# Patient Record
Sex: Female | Born: 1950
Health system: Southern US, Community
[De-identification: ages and names within clinical notes are randomized; demographics above are authoritative.]

## PROBLEM LIST (undated history)

## (undated) DIAGNOSIS — K219 Gastro-esophageal reflux disease without esophagitis: Secondary | ICD-10-CM

## (undated) DIAGNOSIS — I10 Essential (primary) hypertension: Secondary | ICD-10-CM

## (undated) DIAGNOSIS — D869 Sarcoidosis, unspecified: Secondary | ICD-10-CM

## (undated) HISTORY — PX: BREAST CYST ASPIRATION: SHX578

## (undated) HISTORY — DX: Gastro-esophageal reflux disease without esophagitis: K21.9

## (undated) HISTORY — DX: Sarcoidosis, unspecified: D86.9

## (undated) HISTORY — DX: Essential (primary) hypertension: I10

---

## 1998-05-01 ENCOUNTER — Other Ambulatory Visit: Admission: RE | Admit: 1998-05-01 | Discharge: 1998-05-01 | Payer: Self-pay | Admitting: Obstetrics and Gynecology

## 1999-05-23 ENCOUNTER — Other Ambulatory Visit: Admission: RE | Admit: 1999-05-23 | Discharge: 1999-05-23 | Payer: Self-pay | Admitting: Obstetrics and Gynecology

## 1999-07-16 ENCOUNTER — Encounter: Payer: Self-pay | Admitting: Obstetrics and Gynecology

## 1999-07-16 ENCOUNTER — Ambulatory Visit (HOSPITAL_COMMUNITY): Admission: RE | Admit: 1999-07-16 | Discharge: 1999-07-16 | Payer: Self-pay | Admitting: Obstetrics and Gynecology

## 2000-07-23 ENCOUNTER — Encounter: Payer: Self-pay | Admitting: Obstetrics and Gynecology

## 2000-07-23 ENCOUNTER — Encounter: Admission: RE | Admit: 2000-07-23 | Discharge: 2000-07-23 | Payer: Self-pay | Admitting: Obstetrics and Gynecology

## 2000-09-02 ENCOUNTER — Ambulatory Visit (HOSPITAL_COMMUNITY): Admission: RE | Admit: 2000-09-02 | Discharge: 2000-09-02 | Payer: Self-pay | Admitting: Obstetrics and Gynecology

## 2000-09-02 ENCOUNTER — Encounter: Payer: Self-pay | Admitting: Obstetrics and Gynecology

## 2001-08-11 ENCOUNTER — Other Ambulatory Visit: Admission: RE | Admit: 2001-08-11 | Discharge: 2001-08-11 | Payer: Self-pay | Admitting: Obstetrics and Gynecology

## 2001-09-03 ENCOUNTER — Encounter: Admission: RE | Admit: 2001-09-03 | Discharge: 2001-09-03 | Payer: Self-pay | Admitting: Obstetrics and Gynecology

## 2001-09-03 ENCOUNTER — Encounter: Payer: Self-pay | Admitting: Obstetrics and Gynecology

## 2002-01-20 ENCOUNTER — Encounter: Payer: Self-pay | Admitting: Gastroenterology

## 2002-01-20 ENCOUNTER — Encounter: Admission: RE | Admit: 2002-01-20 | Discharge: 2002-01-20 | Payer: Self-pay | Admitting: Gastroenterology

## 2002-04-05 ENCOUNTER — Ambulatory Visit (HOSPITAL_COMMUNITY): Admission: RE | Admit: 2002-04-05 | Discharge: 2002-04-05 | Payer: Self-pay | Admitting: Gastroenterology

## 2002-08-24 ENCOUNTER — Other Ambulatory Visit: Admission: RE | Admit: 2002-08-24 | Discharge: 2002-08-24 | Payer: Self-pay | Admitting: Obstetrics and Gynecology

## 2002-09-08 ENCOUNTER — Encounter: Payer: Self-pay | Admitting: Obstetrics and Gynecology

## 2002-09-08 ENCOUNTER — Encounter: Admission: RE | Admit: 2002-09-08 | Discharge: 2002-09-08 | Payer: Self-pay | Admitting: Obstetrics and Gynecology

## 2003-11-18 ENCOUNTER — Encounter: Admission: RE | Admit: 2003-11-18 | Discharge: 2003-11-18 | Payer: Self-pay | Admitting: Obstetrics and Gynecology

## 2004-11-21 ENCOUNTER — Encounter: Admission: RE | Admit: 2004-11-21 | Discharge: 2004-11-21 | Payer: Self-pay | Admitting: Obstetrics and Gynecology

## 2005-02-27 ENCOUNTER — Encounter: Admission: RE | Admit: 2005-02-27 | Discharge: 2005-02-27 | Payer: Self-pay | Admitting: Family Medicine

## 2005-12-09 ENCOUNTER — Encounter: Admission: RE | Admit: 2005-12-09 | Discharge: 2005-12-09 | Payer: Self-pay | Admitting: Obstetrics and Gynecology

## 2006-03-17 ENCOUNTER — Encounter: Admission: RE | Admit: 2006-03-17 | Discharge: 2006-03-17 | Payer: Self-pay | Admitting: Family Medicine

## 2006-03-27 ENCOUNTER — Encounter: Admission: RE | Admit: 2006-03-27 | Discharge: 2006-03-27 | Payer: Self-pay | Admitting: Family Medicine

## 2006-03-27 ENCOUNTER — Other Ambulatory Visit: Admission: RE | Admit: 2006-03-27 | Discharge: 2006-03-27 | Payer: Self-pay | Admitting: Diagnostic Radiology

## 2006-03-27 ENCOUNTER — Encounter (INDEPENDENT_AMBULATORY_CARE_PROVIDER_SITE_OTHER): Payer: Self-pay | Admitting: Specialist

## 2006-12-12 ENCOUNTER — Encounter: Admission: RE | Admit: 2006-12-12 | Discharge: 2006-12-12 | Payer: Self-pay | Admitting: Obstetrics and Gynecology

## 2007-05-07 ENCOUNTER — Encounter: Admission: RE | Admit: 2007-05-07 | Discharge: 2007-05-07 | Payer: Self-pay | Admitting: Family Medicine

## 2007-05-15 ENCOUNTER — Encounter: Admission: RE | Admit: 2007-05-15 | Discharge: 2007-05-15 | Payer: Self-pay | Admitting: Obstetrics and Gynecology

## 2007-12-21 ENCOUNTER — Encounter: Admission: RE | Admit: 2007-12-21 | Discharge: 2007-12-21 | Payer: Self-pay | Admitting: Obstetrics and Gynecology

## 2008-12-23 ENCOUNTER — Encounter: Admission: RE | Admit: 2008-12-23 | Discharge: 2008-12-23 | Payer: Self-pay | Admitting: Family Medicine

## 2008-12-28 ENCOUNTER — Encounter: Admission: RE | Admit: 2008-12-28 | Discharge: 2008-12-28 | Payer: Self-pay | Admitting: Obstetrics and Gynecology

## 2009-11-01 ENCOUNTER — Encounter (INDEPENDENT_AMBULATORY_CARE_PROVIDER_SITE_OTHER): Payer: Self-pay | Admitting: *Deleted

## 2009-11-29 ENCOUNTER — Encounter (INDEPENDENT_AMBULATORY_CARE_PROVIDER_SITE_OTHER): Payer: Self-pay | Admitting: *Deleted

## 2009-11-30 ENCOUNTER — Ambulatory Visit: Payer: Self-pay | Admitting: Gastroenterology

## 2009-12-08 ENCOUNTER — Ambulatory Visit: Payer: Self-pay | Admitting: Gastroenterology

## 2010-01-01 ENCOUNTER — Encounter: Admission: RE | Admit: 2010-01-01 | Discharge: 2010-01-01 | Payer: Self-pay | Admitting: Obstetrics and Gynecology

## 2010-06-19 NOTE — Procedures (Signed)
Summary: Colonoscopy  Patient: [name] Note: All result statuses are Final unless otherwise noted.  Tests: (1) Colonoscopy (COL)   COL Colonoscopy           DONE     [facility]     [address]     [place], [place]  [address]           COLONOSCOPY PROCEDURE REPORT           PATIENT:  Natasha Stewart, Natasha Stewart  MR#:  [id number]     BIRTHDATE:  01/04/1951, 59 yrs. old  GENDER:  female     ENDOSCOPIST:  Vania Rea. Jarold Motto, MD, [facility]     REF. BY:     PROCEDURE DATE:  12/08/2009     PROCEDURE:  Average-risk screening colonoscopy     G0121     ASA CLASS:  Class II     INDICATIONS:  Routine Risk Screening     MEDICATIONS:   Fentanyl 75 mcg IV, Versed 6 mg IV           DESCRIPTION OF PROCEDURE:   After the risks benefits and     alternatives of the procedure were thoroughly explained, informed     consent was obtained.  Digital rectal exam was performed and     revealed no abnormalities.   The LB CF-H180AL [id number] endoscope     was introduced through the anus and advanced to the cecum, which     was identified by both the appendix and ileocecal valve, without     limitations.  The quality of the prep was excellent, using     MoviPrep.  The instrument was then slowly withdrawn as the colon     was fully examined.     <<PROCEDUREIMAGES>>           FINDINGS:  No polyps or cancers were seen.  This was otherwise a     normal examination of the colon.   Retroflexed views in the rectum     revealed Unable to retroflex.    The scope was then withdrawn from     the patient and the procedure completed.           COMPLICATIONS:  None     ENDOSCOPIC IMPRESSION:     1) No polyps or cancers     2) Otherwise normal examination     3) Unable to retroflex     RECOMMENDATIONS:     1) Continue current colorectal screening recommendations for     "routine risk" patients with a repeat colonoscopy in 10 years.     REPEAT EXAM:  No           ______________________________     Vania Rea.  Jarold Motto, MD, Clement[facility] Graham           CC:  Lupe Carney, MD           n.     Rosalie Doctor:   Vania Rea. Ripley Bogosian at 12/08/2009 04:37 PM           Marilynn Latino, [id number]  Note: An exclamation mark (!) indicates a result that was not dispersed into the flowsheet. Document Creation Date: 12/08/2009 4:38 PM _______________________________________________________________________  (1) Order result status: Final Collection or observation date-time: 12/08/2009 16:31 Requested date-time:  Receipt date-time:  Reported date-time:  Referring Physician:   Ordering Physician: Sheryn Bison [phone]) Specimen Source:  Source: Launa Grill Order Number: [id number] Lab site:   Appended Document: Colonoscopy    Clinical  Lists Changes  Observations: Added new observation of COLONNXTDUE: 11/2019 (12/08/2009 16:56)

## 2010-06-19 NOTE — Miscellaneous (Signed)
Summary: DIR COL .Marland KitchenMarland KitchenEM  Clinical Lists Changes  Medications: Added new medication of MOVIPREP 100 GM  SOLR (PEG-KCL-NACL-NASULF-NA ASC-C) As directed - Signed Rx of MOVIPREP 100 GM  SOLR (PEG-KCL-NACL-NASULF-NA ASC-C) As directed;  #1 x 0;  Signed;  Entered by: Clide Cliff RN;  Authorized by: Mardella Layman MD [facility];  Method used: Print then Give to Patient Observations: Added new observation of ALLERGY REV: Done (11/30/2009 16:13)    Prescriptions: MOVIPREP 100 GM  SOLR (PEG-KCL-NACL-NASULF-NA ASC-C) As directed  #1 x 0   Entered by:   Clide Cliff RN   Authorized by:   Mardella Layman MD [facility]   Signed by:   Clide Cliff RN on 11/30/2009   Method used:   Print then Give to Patient   RxID:   [id number]

## 2010-06-19 NOTE — Letter (Signed)
Summary: [facility] Instructions  [facility]  [address] [place], [place] [address]   Phone: [phone]  Fax: [phone]       Natasha Stewart    1950-12-13    MRN: [id number]        Procedure Day Dorna Bloom:  Farrell Ours  12/08/09     Arrival Time:  3:00PM     Procedure Time:  4:00PM     Location of Procedure:                    Juliann Pares  [facility] (4th Floor)                        PREPARATION FOR COLONOSCOPY WITH MOVIPREP   Starting 5 days prior to your procedure 12/05/09 do not eat nuts, seeds, popcorn, corn, beans, peas,  salads, or any raw vegetables.  Do not take any fiber supplements (e.g. Metamucil, Citrucel, and Benefiber).  THE DAY BEFORE YOUR PROCEDURE         DATE:12/07/09  DAY: THURSDAY  1.  Drink clear liquids the entire day-NO SOLID FOOD  2.  Do not drink anything colored red or purple.  Avoid juices with pulp.  No orange juice.  3.  Drink at least 64 oz. (8 glasses) of fluid/clear liquids during the day to prevent dehydration and help the prep work efficiently.  CLEAR LIQUIDS INCLUDE: Water Jello Ice Popsicles Tea (sugar ok, no milk/cream) Powdered fruit flavored drinks Coffee (sugar ok, no milk/cream) Gatorade Juice: apple, white grape, white cranberry  Lemonade Clear bullion, consomm, broth Carbonated beverages (any kind) Strained chicken noodle soup Hard Candy                             4.  In the morning, mix first dose of MoviPrep solution:    Empty 1 Pouch A and 1 Pouch B into the disposable container    Add lukewarm drinking water to the top line of the container. Mix to dissolve    Refrigerate (mixed solution should be used within 24 hrs)  5.  Begin drinking the prep at 5:00 p.m. The MoviPrep container is divided by 4 marks.   Every 15 minutes drink the solution down to the next mark (approximately 8 oz) until the full liter is complete.   6.  Follow completed prep with 16 oz of clear liquid of your choice (Nothing  red or purple).  Continue to drink clear liquids until bedtime.  7.  Before going to bed, mix second dose of MoviPrep solution:    Empty 1 Pouch A and 1 Pouch B into the disposable container    Add lukewarm drinking water to the top line of the container. Mix to dissolve    Refrigerate  THE DAY OF YOUR PROCEDURE      DATE: 12/08/09  DAY: FRIDAY  Beginning at 11:00AM (5 hours before procedure):         1. Every 15 minutes, drink the solution down to the next mark (approx 8 oz) until the full liter is complete.  2. Follow completed prep with 16 oz. of clear liquid of your choice.    3. You may drink clear liquids until 2:00PM (2 HOURS BEFORE PROCEDURE).   MEDICATION INSTRUCTIONS  Unless otherwise instructed, you should take regular prescription medications with a small sip of water   as early as possible the morning of  your procedure.           OTHER INSTRUCTIONS  You will need a responsible adult at least 59 years of age to accompany you and drive you home.   This person must remain in the waiting room during your procedure.  Wear loose fitting clothing that is easily removed.  Leave jewelry and other valuables at home.  However, you may wish to bring a book to read or  an iPod/MP3 player to listen to music as you wait for your procedure to start.  Remove all body piercing jewelry and leave at home.  Total time from sign-in until discharge is approximately 2-3 hours.  You should go home directly after your procedure and rest.  You can resume normal activities the  day after your procedure.  The day of your procedure you should not:   Drive   Make legal decisions   Operate machinery   Drink alcohol   Return to work  You will receive specific instructions about eating, activities and medications before you leave.    The above instructions have been reviewed and explained to me by   Ernestine Conrad, RN______________________    I fully understand and can  verbalize these instructions _____________________________ Date _________

## 2010-06-19 NOTE — Letter (Signed)
Summary: Previsit letter  [facility]  [address] [place], [place] [address]   Phone: [phone]  Fax: [phone]       11/01/2009 MRN: [id number]  [facility] [address] [place], [place]  [address]  Dear Ms. BAYNES,  Welcome to the Gastroenterology Division at [facility].    You are scheduled to see a nurse for your pre-procedure visit on 11-06-09 at 4:00p.m. on the 3rd floor at [facility], [address]  We ask that you try to arrive at our office 15 minutes prior to your appointment time to allow for check-in.  Your nurse visit will consist of discussing your medical and surgical history, your immediate family medical history, and your medications.    Please bring a complete list of all your medications or, if you prefer, bring the medication bottles and we will list them.  We will need to be aware of both prescribed and over the counter drugs.  We will need to know exact dosage information as well.  If you are on blood thinners (Coumadin, Plavix, Aggrenox, Ticlid, etc.) please call our office today/prior to your appointment, as we need to consult with your physician about holding your medication.   Please be prepared to read and sign documents such as consent forms, a financial agreement, and acknowledgement forms.  If necessary, and with your consent, a friend or relative is welcome to sit-in on the nurse visit with you.  Please bring your insurance card so that we may make a copy of it.  If your insurance requires a referral to see a specialist, please bring your referral form from your primary care physician.  No co-pay is required for this nurse visit.     If you cannot keep your appointment, please call [phone] to cancel or reschedule prior to your appointment date.  This allows [place] the opportunity to schedule an appointment for another patient in need of care.    Thank you for choosing [facility] for your medical needs.   We appreciate the opportunity to care for you.  Please visit [place] at our website  to learn more about our practice.                     Sincerely.                                                                                                                   The Gastroenterology Division

## 2010-07-06 ENCOUNTER — Other Ambulatory Visit: Payer: Self-pay | Admitting: Obstetrics and Gynecology

## 2010-07-06 DIAGNOSIS — M8580 Other specified disorders of bone density and structure, unspecified site: Secondary | ICD-10-CM

## 2010-10-05 NOTE — Op Note (Signed)
   Natasha Stewart, Natasha Stewart                          ACCOUNT NO.:  [id number]   MEDICAL RECORD NO.:  [id number]                   PATIENT TYPE:  AMB   LOCATION:  ENDO                                 FACILITY:  [facility]   PHYSICIAN:  James L. Malon Kindle., M.D.          DATE OF BIRTH:  April 19, 1951   DATE OF PROCEDURE:  04/05/2002  DATE OF DISCHARGE:  04/05/2002                                 OPERATIVE REPORT   PROCEDURE:  Esophageal manometry.   INDICATIONS:  Dysphagia with negative endoscopy and basically negative  barium swallow showing some mild spasm without evidence of any other  abnormalities.  Manometry is performed to look for signs of esophageal  spasm.  She is currently being treated for reflux.   DESCRIPTION OF PROCEDURE:  The procedure was performed at [facility]  manometry lab in the usual manner.  No provocative studies were performed.  The results were as follows:   1. Upper esophageal sphincter poorly-seen.  2. Esophageal body:  Mean pressure 96 mm, mean duration of 4.4 seconds in     the distal esophagus.  Peristalsis was normal in all 10 swallows     presented to me.  3. Lower esophageal sphincter:  Mean pressure of 39 with 85% relaxation, and     this was confirmed by the tracings presented.   ASSESSMENT:  Dysphagia with normal manometry, with no evidence of achalasia  or esophageal spasm.   PLAN:  Will continue to treat for reflux and see back in the office and  discuss things further with her.                                               James L. Malon Kindle., M.D.    Waldron Session  D:  04/20/2002  T:  04/21/2002  Job:  [id number]   cc:   L. Lupe Carney, M.D.  [address] [place]  [place] [address]  Fax: [phone]

## 2010-11-16 ENCOUNTER — Telehealth: Payer: Self-pay | Admitting: Gastroenterology

## 2010-11-16 NOTE — Telephone Encounter (Signed)
COLON 12/08/2009 that was normal except Dr Jarold Motto was unable to retroflex the scope. lmom for pt to call back.

## 2010-11-19 NOTE — Telephone Encounter (Signed)
Explained to pt that her COLON was normal except for the retroflex problem which could have been d/t her muscles being tight or a smaller rectum. Pt stated she's just looking for a reason for the swelling on her right side at her belly button. She reports the feeling is uncomfortable in her clothes. No pain, no diarrhea or constipation- she's regular. She states the only med she's on is HTN med and vitamins. She denies reflyx. Pt stated has has seen  her GYN and has no any problems. I offered an appt with Dr Jarold Motto, but she will see her PCP.

## 2010-12-04 ENCOUNTER — Other Ambulatory Visit: Payer: Self-pay | Admitting: Obstetrics and Gynecology

## 2010-12-04 DIAGNOSIS — Z1231 Encounter for screening mammogram for malignant neoplasm of breast: Secondary | ICD-10-CM

## 2011-01-28 ENCOUNTER — Ambulatory Visit
Admission: RE | Admit: 2011-01-28 | Discharge: 2011-01-28 | Disposition: A | Discharge status: Home / Self Care | Payer: BC Managed Care – PPO | Source: Ambulatory Visit | Attending: Obstetrics and Gynecology | Admitting: Obstetrics and Gynecology

## 2011-01-28 DIAGNOSIS — Z1231 Encounter for screening mammogram for malignant neoplasm of breast: Secondary | ICD-10-CM

## 2011-04-12 IMAGING — CR DG CHEST 2V
2 series · 2 of 2 positions shown · non-contrast
Comparison: [HOSPITAL] at [REDACTED] [HOSPITAL] the chest x-ray
02/27/2005 and 07/07/2006.

CLINICAL DATA: History sarcoidosis.  Cough.  Nonsmoker.

CHEST - 2 VIEW

[w chest pa]
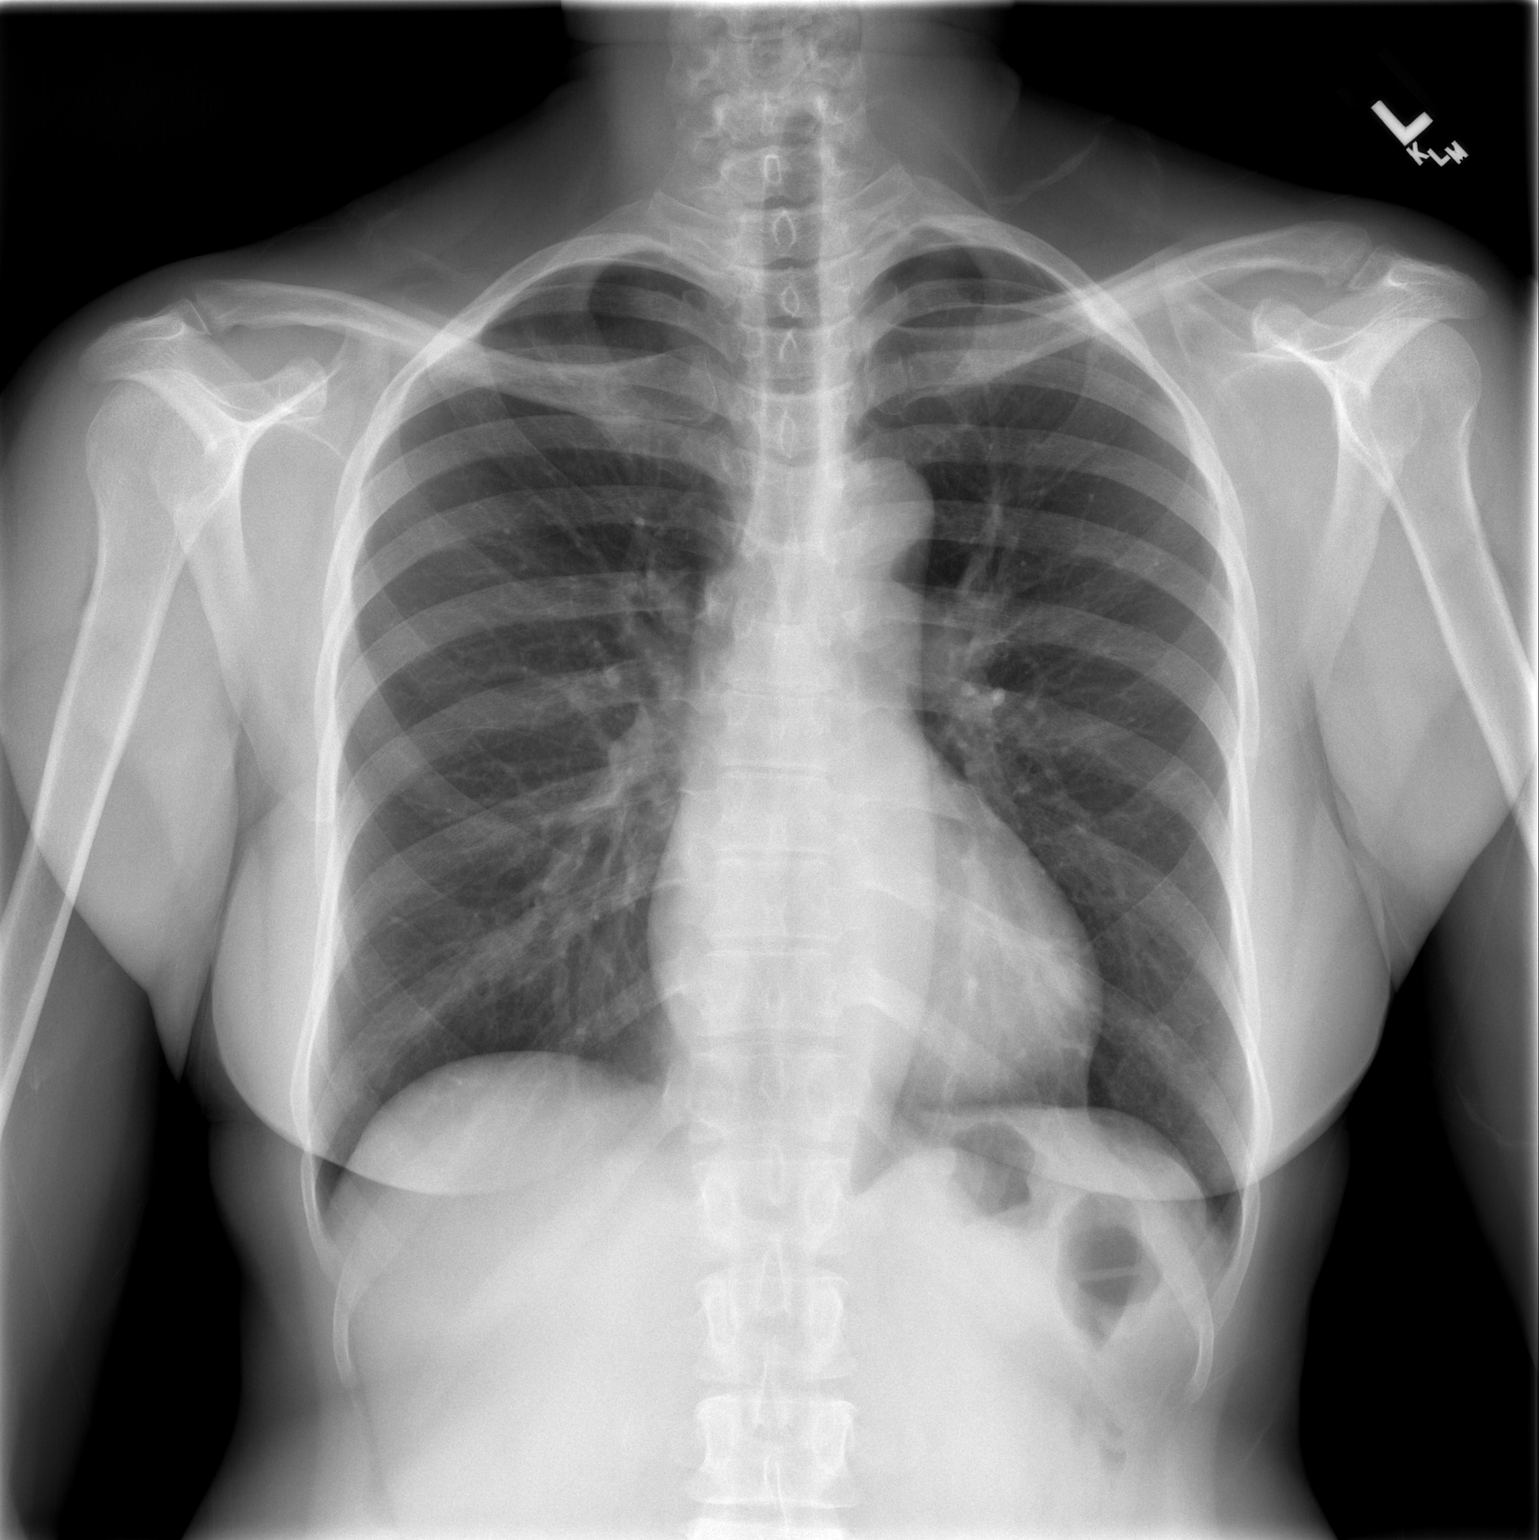

[w chest lat]
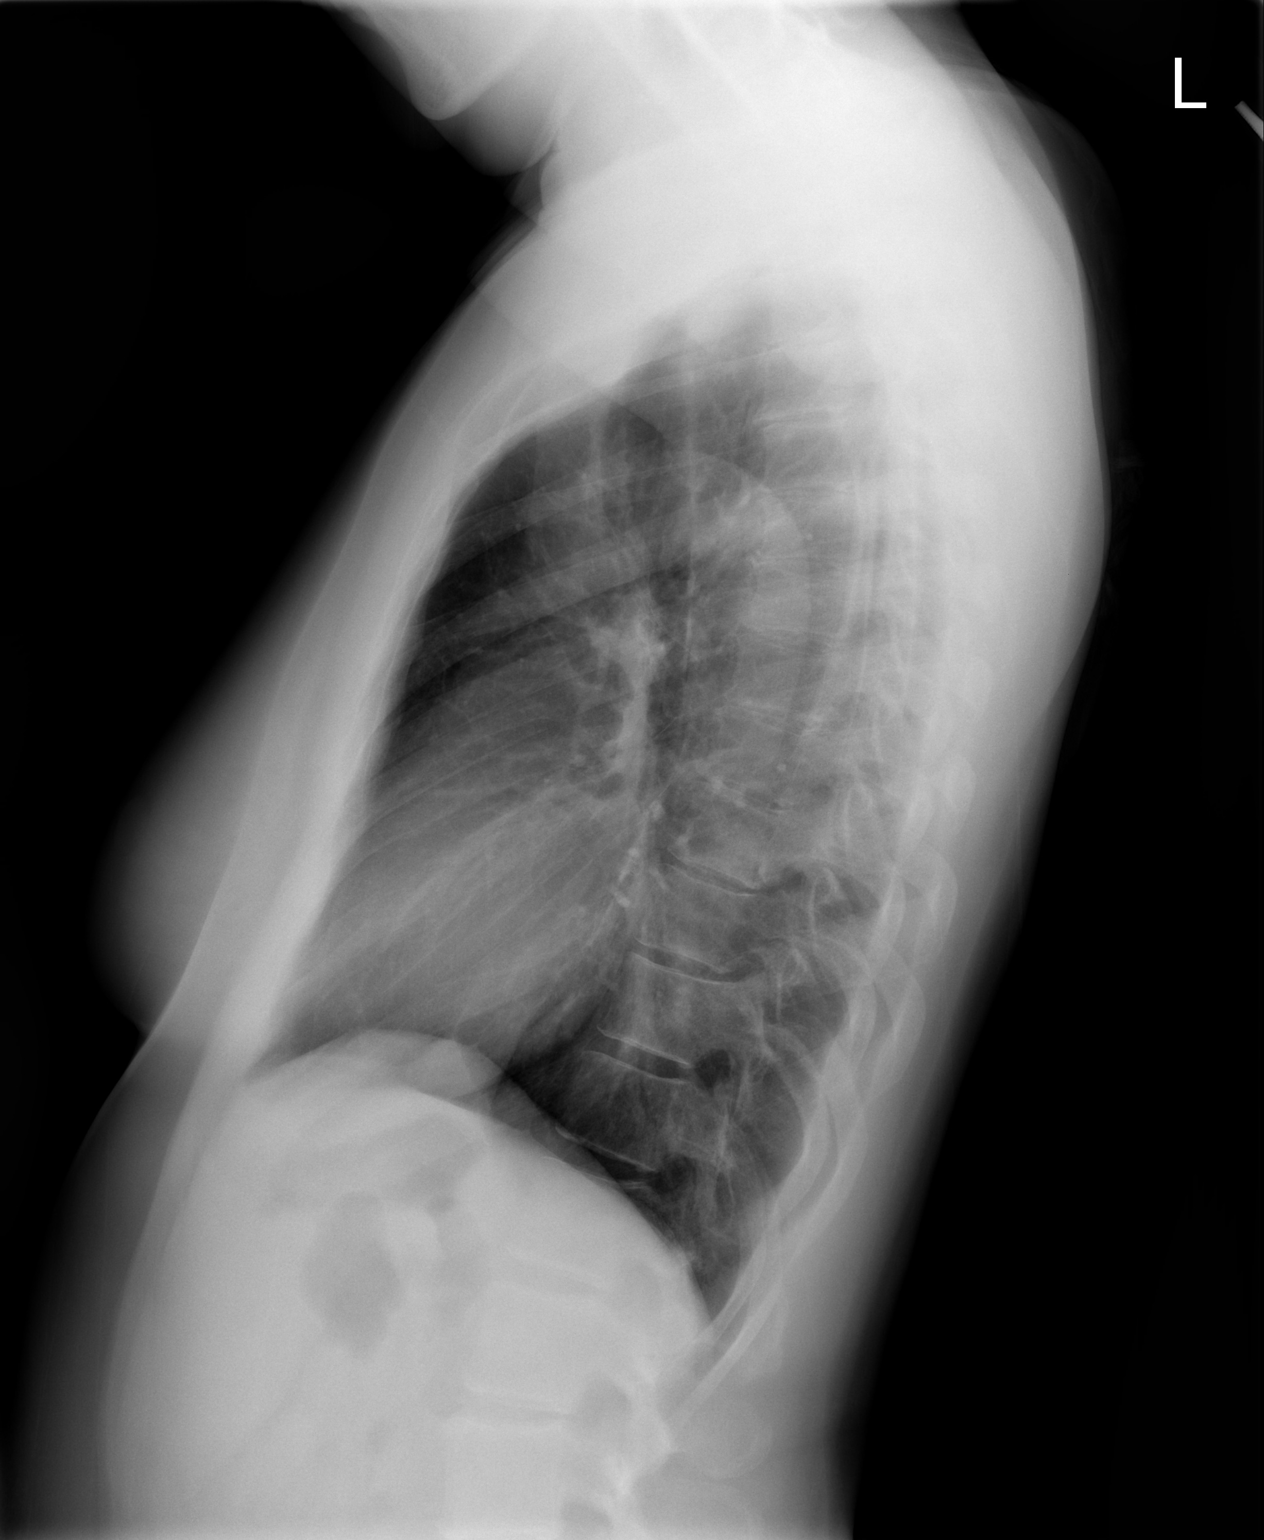

[2 of 2 positions shown; findings below may reference images not displayed]

FINDINGS: Lungs remain clear.  Heart size and configuration are
normal.  Mediastinum, hila, pleura and osseous structures remains
stable and normal-appearing for age.
IMPRESSION: Stable.  No active cardiopulmonary disease.

## 2012-01-08 ENCOUNTER — Other Ambulatory Visit: Payer: Self-pay | Admitting: Obstetrics and Gynecology

## 2012-01-08 DIAGNOSIS — Z1231 Encounter for screening mammogram for malignant neoplasm of breast: Secondary | ICD-10-CM

## 2012-02-05 ENCOUNTER — Ambulatory Visit
Admission: RE | Admit: 2012-02-05 | Discharge: 2012-02-05 | Disposition: A | Discharge status: Home / Self Care | Payer: BC Managed Care – PPO | Source: Ambulatory Visit | Attending: Obstetrics and Gynecology | Admitting: Obstetrics and Gynecology

## 2012-02-05 DIAGNOSIS — Z1231 Encounter for screening mammogram for malignant neoplasm of breast: Secondary | ICD-10-CM

## 2013-01-07 ENCOUNTER — Other Ambulatory Visit: Payer: Self-pay

## 2013-01-07 DIAGNOSIS — Z1231 Encounter for screening mammogram for malignant neoplasm of breast: Secondary | ICD-10-CM

## 2013-02-08 ENCOUNTER — Ambulatory Visit
Admission: RE | Admit: 2013-02-08 | Discharge: 2013-02-08 | Disposition: A | Discharge status: Home / Self Care | Payer: BC Managed Care – PPO | Source: Ambulatory Visit

## 2013-02-08 DIAGNOSIS — Z1231 Encounter for screening mammogram for malignant neoplasm of breast: Secondary | ICD-10-CM

## 2014-01-10 ENCOUNTER — Other Ambulatory Visit: Payer: Self-pay

## 2014-01-10 DIAGNOSIS — Z1231 Encounter for screening mammogram for malignant neoplasm of breast: Secondary | ICD-10-CM

## 2014-02-11 ENCOUNTER — Ambulatory Visit
Admission: RE | Admit: 2014-02-11 | Discharge: 2014-02-11 | Disposition: A | Discharge status: Home / Self Care | Payer: BC Managed Care – PPO | Source: Ambulatory Visit

## 2014-02-11 ENCOUNTER — Encounter (INDEPENDENT_AMBULATORY_CARE_PROVIDER_SITE_OTHER): Payer: Self-pay

## 2014-02-11 DIAGNOSIS — Z1231 Encounter for screening mammogram for malignant neoplasm of breast: Secondary | ICD-10-CM

## 2014-10-07 ENCOUNTER — Encounter: Payer: Self-pay | Admitting: Gastroenterology

## 2015-01-09 ENCOUNTER — Other Ambulatory Visit: Payer: Self-pay

## 2015-01-09 DIAGNOSIS — Z1231 Encounter for screening mammogram for malignant neoplasm of breast: Secondary | ICD-10-CM

## 2015-02-15 ENCOUNTER — Ambulatory Visit
Admission: RE | Admit: 2015-02-15 | Discharge: 2015-02-15 | Disposition: A | Discharge status: Home / Self Care | Payer: 59 | Source: Ambulatory Visit

## 2015-02-15 DIAGNOSIS — Z1231 Encounter for screening mammogram for malignant neoplasm of breast: Secondary | ICD-10-CM

## 2016-01-26 ENCOUNTER — Other Ambulatory Visit: Payer: Self-pay | Admitting: Obstetrics and Gynecology

## 2016-01-26 DIAGNOSIS — Z1231 Encounter for screening mammogram for malignant neoplasm of breast: Secondary | ICD-10-CM

## 2016-02-28 ENCOUNTER — Ambulatory Visit
Admission: RE | Admit: 2016-02-28 | Discharge: 2016-02-28 | Disposition: A | Discharge status: Home / Self Care | Payer: 59 | Source: Ambulatory Visit | Attending: Obstetrics and Gynecology | Admitting: Obstetrics and Gynecology

## 2016-02-28 DIAGNOSIS — Z1231 Encounter for screening mammogram for malignant neoplasm of breast: Secondary | ICD-10-CM

## 2017-01-17 ENCOUNTER — Other Ambulatory Visit: Payer: Self-pay | Admitting: Obstetrics and Gynecology

## 2017-01-17 DIAGNOSIS — Z1231 Encounter for screening mammogram for malignant neoplasm of breast: Secondary | ICD-10-CM

## 2017-03-12 ENCOUNTER — Ambulatory Visit
Admission: RE | Admit: 2017-03-12 | Discharge: 2017-03-12 | Disposition: A | Discharge status: Home / Self Care | Payer: 59 | Source: Ambulatory Visit | Attending: Obstetrics and Gynecology | Admitting: Obstetrics and Gynecology

## 2017-03-12 DIAGNOSIS — Z1231 Encounter for screening mammogram for malignant neoplasm of breast: Secondary | ICD-10-CM

## 2017-03-13 ENCOUNTER — Other Ambulatory Visit: Payer: Self-pay | Admitting: Obstetrics and Gynecology

## 2017-03-13 DIAGNOSIS — R928 Other abnormal and inconclusive findings on diagnostic imaging of breast: Secondary | ICD-10-CM

## 2017-03-21 ENCOUNTER — Ambulatory Visit: Payer: 59

## 2017-03-21 ENCOUNTER — Ambulatory Visit
Admission: RE | Admit: 2017-03-21 | Discharge: 2017-03-21 | Disposition: A | Discharge status: Home / Self Care | Payer: 59 | Source: Ambulatory Visit | Attending: Obstetrics and Gynecology | Admitting: Obstetrics and Gynecology

## 2017-03-21 DIAGNOSIS — R928 Other abnormal and inconclusive findings on diagnostic imaging of breast: Secondary | ICD-10-CM

## 2018-06-23 DIAGNOSIS — B9789 Other viral agents as the cause of diseases classified elsewhere: Secondary | ICD-10-CM | POA: Diagnosis not present

## 2018-06-23 DIAGNOSIS — J069 Acute upper respiratory infection, unspecified: Secondary | ICD-10-CM | POA: Diagnosis not present

## 2018-06-23 DIAGNOSIS — I10 Essential (primary) hypertension: Secondary | ICD-10-CM | POA: Diagnosis not present

## 2018-11-04 DIAGNOSIS — H35013 Changes in retinal vascular appearance, bilateral: Secondary | ICD-10-CM | POA: Diagnosis not present

## 2018-11-04 DIAGNOSIS — H35363 Drusen (degenerative) of macula, bilateral: Secondary | ICD-10-CM | POA: Diagnosis not present

## 2018-11-04 DIAGNOSIS — H40013 Open angle with borderline findings, low risk, bilateral: Secondary | ICD-10-CM | POA: Diagnosis not present

## 2018-11-04 DIAGNOSIS — H2513 Age-related nuclear cataract, bilateral: Secondary | ICD-10-CM | POA: Diagnosis not present

## 2018-12-08 DIAGNOSIS — D649 Anemia, unspecified: Secondary | ICD-10-CM | POA: Diagnosis not present

## 2018-12-08 DIAGNOSIS — I10 Essential (primary) hypertension: Secondary | ICD-10-CM | POA: Diagnosis not present

## 2018-12-08 DIAGNOSIS — Z0000 Encounter for general adult medical examination without abnormal findings: Secondary | ICD-10-CM | POA: Diagnosis not present

## 2018-12-11 DIAGNOSIS — Z9189 Other specified personal risk factors, not elsewhere classified: Secondary | ICD-10-CM | POA: Diagnosis not present

## 2018-12-11 DIAGNOSIS — R05 Cough: Secondary | ICD-10-CM | POA: Diagnosis not present

## 2018-12-11 DIAGNOSIS — Z23 Encounter for immunization: Secondary | ICD-10-CM | POA: Diagnosis not present

## 2018-12-11 DIAGNOSIS — Z791 Long term (current) use of non-steroidal anti-inflammatories (NSAID): Secondary | ICD-10-CM | POA: Diagnosis not present

## 2018-12-11 DIAGNOSIS — Z0000 Encounter for general adult medical examination without abnormal findings: Secondary | ICD-10-CM | POA: Diagnosis not present

## 2018-12-11 DIAGNOSIS — M5442 Lumbago with sciatica, left side: Secondary | ICD-10-CM | POA: Diagnosis not present

## 2018-12-11 DIAGNOSIS — I10 Essential (primary) hypertension: Secondary | ICD-10-CM | POA: Diagnosis not present

## 2018-12-11 DIAGNOSIS — G8929 Other chronic pain: Secondary | ICD-10-CM | POA: Diagnosis not present

## 2018-12-23 DIAGNOSIS — J984 Other disorders of lung: Secondary | ICD-10-CM | POA: Diagnosis not present

## 2019-02-12 DIAGNOSIS — Z23 Encounter for immunization: Secondary | ICD-10-CM | POA: Diagnosis not present

## 2019-03-24 DIAGNOSIS — M79645 Pain in left finger(s): Secondary | ICD-10-CM | POA: Diagnosis not present

## 2019-03-24 DIAGNOSIS — M65312 Trigger thumb, left thumb: Secondary | ICD-10-CM | POA: Diagnosis not present

## 2019-04-27 DIAGNOSIS — Z01419 Encounter for gynecological examination (general) (routine) without abnormal findings: Secondary | ICD-10-CM | POA: Diagnosis not present

## 2019-04-27 DIAGNOSIS — Z1231 Encounter for screening mammogram for malignant neoplasm of breast: Secondary | ICD-10-CM | POA: Diagnosis not present

## 2019-04-28 DIAGNOSIS — M65312 Trigger thumb, left thumb: Secondary | ICD-10-CM | POA: Diagnosis not present

## 2019-05-18 DIAGNOSIS — H40013 Open angle with borderline findings, low risk, bilateral: Secondary | ICD-10-CM | POA: Diagnosis not present

## 2019-05-18 DIAGNOSIS — H04123 Dry eye syndrome of bilateral lacrimal glands: Secondary | ICD-10-CM | POA: Diagnosis not present

## 2019-06-15 DIAGNOSIS — I10 Essential (primary) hypertension: Secondary | ICD-10-CM | POA: Diagnosis not present

## 2019-06-15 DIAGNOSIS — M5442 Lumbago with sciatica, left side: Secondary | ICD-10-CM | POA: Diagnosis not present

## 2019-06-15 DIAGNOSIS — G8929 Other chronic pain: Secondary | ICD-10-CM | POA: Diagnosis not present

## 2019-07-11 ENCOUNTER — Ambulatory Visit: Payer: PPO | Attending: Internal Medicine

## 2019-07-11 DIAGNOSIS — Z23 Encounter for immunization: Secondary | ICD-10-CM | POA: Insufficient documentation

## 2019-07-11 NOTE — Progress Notes (Signed)
   Covid-19 Vaccination Clinic  Name:  Natasha Stewart    MRN: [id number] DOB: 1950/07/28  07/11/2019  [name] was observed post Covid-19 immunization for 15 minutes without incidence. She was provided with Vaccine Information Sheet and instruction to access the V-Safe system.   [name] was instructed to call 911 with any severe reactions post vaccine: Marland Kitchen Difficulty breathing  . Swelling of your face and throat  . A fast heartbeat  . A bad rash all over your body  . Dizziness and weakness    Immunizations Administered    Name Date Dose VIS Date Route   Pfizer COVID-19 Vaccine 07/11/2019  2:55 PM 0.3 mL 04/30/2019 Intramuscular   Manufacturer: [place]   Lot: [id number]   [place]: [id number]

## 2019-08-04 ENCOUNTER — Ambulatory Visit: Payer: PPO | Attending: Internal Medicine

## 2019-08-04 DIAGNOSIS — Z23 Encounter for immunization: Secondary | ICD-10-CM

## 2019-08-04 NOTE — Progress Notes (Signed)
   Covid-19 Vaccination Clinic  Name:  Natasha Stewart    MRN: [id number] DOB: Jun 24, 1950  08/04/2019  [name] was observed post Covid-19 immunization for 15 minutes without incident. She was provided with Vaccine Information Sheet and instruction to access the V-Safe system.   [name] was instructed to call 911 with any severe reactions post vaccine: Marland Kitchen Difficulty breathing  . Swelling of face and throat  . A fast heartbeat  . A bad rash all over body  . Dizziness and weakness   Immunizations Administered    Name Date Dose VIS Date Route   Pfizer COVID-19 Vaccine 08/04/2019  2:35 PM 0.3 mL 04/30/2019 Intramuscular   Manufacturer: [place]   Lot: [id number]   [place]: [id number]

## 2019-08-18 DIAGNOSIS — M5137 Other intervertebral disc degeneration, lumbosacral region: Secondary | ICD-10-CM | POA: Diagnosis not present

## 2019-08-18 DIAGNOSIS — M5442 Lumbago with sciatica, left side: Secondary | ICD-10-CM | POA: Diagnosis not present

## 2019-08-18 DIAGNOSIS — M4697 Unspecified inflammatory spondylopathy, lumbosacral region: Secondary | ICD-10-CM | POA: Diagnosis not present

## 2019-08-18 DIAGNOSIS — G8929 Other chronic pain: Secondary | ICD-10-CM | POA: Diagnosis not present

## 2019-08-18 DIAGNOSIS — M4696 Unspecified inflammatory spondylopathy, lumbar region: Secondary | ICD-10-CM | POA: Diagnosis not present

## 2019-08-18 DIAGNOSIS — M5136 Other intervertebral disc degeneration, lumbar region: Secondary | ICD-10-CM | POA: Diagnosis not present

## 2019-08-31 DIAGNOSIS — R29898 Other symptoms and signs involving the musculoskeletal system: Secondary | ICD-10-CM | POA: Diagnosis not present

## 2019-08-31 DIAGNOSIS — G8929 Other chronic pain: Secondary | ICD-10-CM | POA: Diagnosis not present

## 2019-08-31 DIAGNOSIS — M6289 Other specified disorders of muscle: Secondary | ICD-10-CM | POA: Diagnosis not present

## 2019-08-31 DIAGNOSIS — M5442 Lumbago with sciatica, left side: Secondary | ICD-10-CM | POA: Diagnosis not present

## 2019-08-31 DIAGNOSIS — Z7409 Other reduced mobility: Secondary | ICD-10-CM | POA: Diagnosis not present

## 2019-08-31 DIAGNOSIS — Z789 Other specified health status: Secondary | ICD-10-CM | POA: Diagnosis not present

## 2019-09-21 DIAGNOSIS — G8929 Other chronic pain: Secondary | ICD-10-CM | POA: Diagnosis not present

## 2019-09-21 DIAGNOSIS — R29898 Other symptoms and signs involving the musculoskeletal system: Secondary | ICD-10-CM | POA: Diagnosis not present

## 2019-09-21 DIAGNOSIS — M5442 Lumbago with sciatica, left side: Secondary | ICD-10-CM | POA: Diagnosis not present

## 2019-09-21 DIAGNOSIS — M6289 Other specified disorders of muscle: Secondary | ICD-10-CM | POA: Diagnosis not present

## 2019-09-21 DIAGNOSIS — Z789 Other specified health status: Secondary | ICD-10-CM | POA: Diagnosis not present

## 2019-09-21 DIAGNOSIS — Z7409 Other reduced mobility: Secondary | ICD-10-CM | POA: Diagnosis not present

## 2019-09-28 DIAGNOSIS — Z7409 Other reduced mobility: Secondary | ICD-10-CM | POA: Diagnosis not present

## 2019-09-28 DIAGNOSIS — R29898 Other symptoms and signs involving the musculoskeletal system: Secondary | ICD-10-CM | POA: Diagnosis not present

## 2019-09-28 DIAGNOSIS — M5442 Lumbago with sciatica, left side: Secondary | ICD-10-CM | POA: Diagnosis not present

## 2019-09-28 DIAGNOSIS — M6289 Other specified disorders of muscle: Secondary | ICD-10-CM | POA: Diagnosis not present

## 2019-09-28 DIAGNOSIS — G8929 Other chronic pain: Secondary | ICD-10-CM | POA: Diagnosis not present

## 2019-09-28 DIAGNOSIS — Z789 Other specified health status: Secondary | ICD-10-CM | POA: Diagnosis not present

## 2019-10-12 DIAGNOSIS — R29898 Other symptoms and signs involving the musculoskeletal system: Secondary | ICD-10-CM | POA: Diagnosis not present

## 2019-10-12 DIAGNOSIS — M6289 Other specified disorders of muscle: Secondary | ICD-10-CM | POA: Diagnosis not present

## 2019-10-12 DIAGNOSIS — M5442 Lumbago with sciatica, left side: Secondary | ICD-10-CM | POA: Diagnosis not present

## 2019-10-12 DIAGNOSIS — Z789 Other specified health status: Secondary | ICD-10-CM | POA: Diagnosis not present

## 2019-10-12 DIAGNOSIS — G8929 Other chronic pain: Secondary | ICD-10-CM | POA: Diagnosis not present

## 2019-10-12 DIAGNOSIS — Z7409 Other reduced mobility: Secondary | ICD-10-CM | POA: Diagnosis not present

## 2019-12-17 DIAGNOSIS — D649 Anemia, unspecified: Secondary | ICD-10-CM | POA: Diagnosis not present

## 2019-12-17 DIAGNOSIS — Z9189 Other specified personal risk factors, not elsewhere classified: Secondary | ICD-10-CM | POA: Diagnosis not present

## 2019-12-17 DIAGNOSIS — I10 Essential (primary) hypertension: Secondary | ICD-10-CM | POA: Diagnosis not present

## 2019-12-21 DIAGNOSIS — Z1211 Encounter for screening for malignant neoplasm of colon: Secondary | ICD-10-CM | POA: Diagnosis not present

## 2019-12-21 DIAGNOSIS — J019 Acute sinusitis, unspecified: Secondary | ICD-10-CM | POA: Diagnosis not present

## 2019-12-21 DIAGNOSIS — D649 Anemia, unspecified: Secondary | ICD-10-CM | POA: Diagnosis not present

## 2019-12-21 DIAGNOSIS — I10 Essential (primary) hypertension: Secondary | ICD-10-CM | POA: Diagnosis not present

## 2019-12-21 DIAGNOSIS — Z9189 Other specified personal risk factors, not elsewhere classified: Secondary | ICD-10-CM | POA: Diagnosis not present

## 2019-12-21 DIAGNOSIS — Z79899 Other long term (current) drug therapy: Secondary | ICD-10-CM | POA: Diagnosis not present

## 2019-12-21 DIAGNOSIS — Z0000 Encounter for general adult medical examination without abnormal findings: Secondary | ICD-10-CM | POA: Diagnosis not present

## 2020-01-18 DIAGNOSIS — H524 Presbyopia: Secondary | ICD-10-CM | POA: Diagnosis not present

## 2020-01-18 DIAGNOSIS — H04123 Dry eye syndrome of bilateral lacrimal glands: Secondary | ICD-10-CM | POA: Diagnosis not present

## 2020-01-18 DIAGNOSIS — H40013 Open angle with borderline findings, low risk, bilateral: Secondary | ICD-10-CM | POA: Diagnosis not present

## 2020-01-18 DIAGNOSIS — H35363 Drusen (degenerative) of macula, bilateral: Secondary | ICD-10-CM | POA: Diagnosis not present

## 2020-01-18 DIAGNOSIS — H35013 Changes in retinal vascular appearance, bilateral: Secondary | ICD-10-CM | POA: Diagnosis not present

## 2020-01-18 DIAGNOSIS — H2513 Age-related nuclear cataract, bilateral: Secondary | ICD-10-CM | POA: Diagnosis not present

## 2020-03-04 DIAGNOSIS — Z23 Encounter for immunization: Secondary | ICD-10-CM | POA: Diagnosis not present

## 2020-03-07 DIAGNOSIS — E041 Nontoxic single thyroid nodule: Secondary | ICD-10-CM | POA: Diagnosis not present

## 2020-03-07 DIAGNOSIS — H9312 Tinnitus, left ear: Secondary | ICD-10-CM | POA: Diagnosis not present

## 2020-03-07 DIAGNOSIS — H903 Sensorineural hearing loss, bilateral: Secondary | ICD-10-CM | POA: Diagnosis not present

## 2020-03-09 DIAGNOSIS — E041 Nontoxic single thyroid nodule: Secondary | ICD-10-CM | POA: Diagnosis not present

## 2020-03-30 DIAGNOSIS — E042 Nontoxic multinodular goiter: Secondary | ICD-10-CM | POA: Diagnosis not present

## 2020-05-01 DIAGNOSIS — Z1231 Encounter for screening mammogram for malignant neoplasm of breast: Secondary | ICD-10-CM | POA: Diagnosis not present

## 2020-06-16 ENCOUNTER — Ambulatory Visit (AMBULATORY_SURGERY_CENTER): Payer: Self-pay

## 2020-06-16 ENCOUNTER — Other Ambulatory Visit: Payer: Self-pay

## 2020-06-16 VITALS — Ht 62.0 in | Wt 153.0 lb

## 2020-06-16 DIAGNOSIS — Z1211 Encounter for screening for malignant neoplasm of colon: Secondary | ICD-10-CM

## 2020-06-16 MED ORDER — SUTAB 1479-225-188 MG PO TABS: 12.0000 | ORAL_TABLET | ORAL | 0 refills | Status: DC

## 2020-06-16 NOTE — Progress Notes (Signed)
No allergies to soy or egg Pt is not on blood thinners or diet pills  Unable to assess sedation/intubation has had no surgeries but has had colonoscopy and breast cyst aspiration  Denies atrial flutter/fib Denies constipation   Emmi instructions given to pt  Pt is aware of Covid safety and care partner requirements.

## 2020-06-21 ENCOUNTER — Encounter: Payer: Self-pay | Admitting: Gastroenterology

## 2020-06-22 DIAGNOSIS — I10 Essential (primary) hypertension: Secondary | ICD-10-CM | POA: Diagnosis not present

## 2020-06-30 ENCOUNTER — Encounter: Payer: Self-pay | Admitting: Gastroenterology

## 2020-06-30 ENCOUNTER — Other Ambulatory Visit: Payer: Self-pay

## 2020-06-30 ENCOUNTER — Ambulatory Visit (AMBULATORY_SURGERY_CENTER): Payer: PPO | Admitting: Gastroenterology

## 2020-06-30 VITALS — BP 116/74 | HR 71 | Temp 97.6°F | Resp 16 | Ht 62.0 in | Wt 153.0 lb

## 2020-06-30 DIAGNOSIS — K5730 Diverticulosis of large intestine without perforation or abscess without bleeding: Secondary | ICD-10-CM

## 2020-06-30 DIAGNOSIS — Z1211 Encounter for screening for malignant neoplasm of colon: Secondary | ICD-10-CM | POA: Diagnosis not present

## 2020-06-30 DIAGNOSIS — D122 Benign neoplasm of ascending colon: Secondary | ICD-10-CM

## 2020-06-30 DIAGNOSIS — K640 First degree hemorrhoids: Secondary | ICD-10-CM

## 2020-06-30 DIAGNOSIS — D125 Benign neoplasm of sigmoid colon: Secondary | ICD-10-CM

## 2020-06-30 DIAGNOSIS — D124 Benign neoplasm of descending colon: Secondary | ICD-10-CM | POA: Diagnosis not present

## 2020-06-30 MED ORDER — SODIUM CHLORIDE 0.9 % IV SOLN: 500.0000 mL | Freq: Once | INTRAVENOUS | Status: DC

## 2020-06-30 NOTE — Progress Notes (Signed)
Pt. Reports no change in her medical or surgical history since her pre-visit 06/16/2020.

## 2020-06-30 NOTE — Op Note (Signed)
[facility] Patient Name: Natasha Stewart Procedure Date: 06/30/2020 11:39 AM MRN: [id number] Endoscopist: Gerrit Heck , MD Age: 70 Referring MD:  Date of Birth: 1951-05-06 Gender: Female Account #: [id number] Procedure:                Colonoscopy Indications:              Screening for colorectal malignant neoplasm (last                            colonoscopy was 10 years ago)                           - Colonoscopy 11/2009 was normal. Medicines:                Monitored Anesthesia Care Procedure:                Pre-Anesthesia Assessment:                           - Prior to the procedure, a History and Physical                            was performed, and patient medications and                            allergies were reviewed. The patient's tolerance of                            previous anesthesia was also reviewed. The risks                            and benefits of the procedure and the sedation                            options and risks were discussed with the patient.                            All questions were answered, and informed consent                            was obtained. Prior Anticoagulants: The patient has                            taken no previous anticoagulant or antiplatelet                            agents. ASA Grade Assessment: II - A patient with                            mild systemic disease. After reviewing the risks                            and benefits, the patient was deemed in  satisfactory condition to undergo the procedure.                           After obtaining informed consent, the colonoscope                            was passed under direct vision. Throughout the                            procedure, the patient's blood pressure, pulse, and                            oxygen saturations were monitored continuously. The                            Olympus [id number]) [id number] was  introduced                            through the anus and advanced to the the terminal                            ileum. The colonoscopy was performed without                            difficulty. The patient tolerated the procedure                            well. The quality of the bowel preparation was                            good. The terminal ileum, ileocecal valve,                            appendiceal orifice, and rectum were photographed. Scope In: 11:52:04 AM Scope Out: 12:11:04 PM Scope Withdrawal Time: 0 hours 15 minutes 37 seconds  Total Procedure Duration: 0 hours 19 minutes 0 seconds  Findings:                 The perianal and digital rectal examinations were                            normal.                           Three sessile polyps were found in the sigmoid                            colon, descending colon, and ascending colon. The                            polyps were 2 to 8 mm in size. These polyps were                            removed with a cold snare. Resection and retrieval  were complete. Estimated blood loss was minimal.                           A few small-mouthed diverticula were found in the                            sigmoid colon and transverse colon.                           Non-bleeding internal hemorrhoids were found during                            retroflexion. The hemorrhoids were small and Grade                            I (internal hemorrhoids that do not prolapse).                           The terminal ileum appeared normal. Complications:            No immediate complications. Estimated Blood Loss:     Estimated blood loss was minimal. Impression:               - Three 2 to 8 mm polyps in the sigmoid colon, in                            the descending colon and in the ascending colon,                            removed with a cold snare. Resected and retrieved.                           - Diverticulosis  in the sigmoid colon and in the                            transverse colon.                           - Non-bleeding internal hemorrhoids.                           - The examined portion of the ileum was normal. Recommendation:           - Patient has a contact number available for                            emergencies. The signs and symptoms of potential                            delayed complications were discussed with the                            patient. Return to normal activities tomorrow.  Written discharge instructions were provided to the                            patient.                           - Resume previous diet.                           - Continue present medications.                           - Await pathology results.                           - Repeat colonoscopy for surveillance based on                            pathology results.                           - Use fiber, for example Citrucel, Fibercon, Konsyl                            or Metamucil.                           - Internal hemorrhoids were noted on this study and                            may be amenable to hemorrhoid band ligation. If you                            are interested in further treatment of these                            hemorrhoids with band ligation, please contact my                            clinic to set up an appointment for evaluation and                            treatment. Gerrit Heck, MD 06/30/2020 12:16:09 PM

## 2020-06-30 NOTE — Progress Notes (Signed)
A and O x3. Report to RN. Tolerated MAC anesthesia well.

## 2020-06-30 NOTE — Patient Instructions (Signed)
Handouts provided on polyps, diverticulosis and hemorrhoids.   Use fiber, for example Citrucel, Fibercon, Konsyl or Metamucil.   Internal hemorrhoids were noted on this study and may be amenable to hemorrhoid band ligation (see pamphlet provided). If you are interested in further treatment of these hemorrhoids with band ligation, please contact my clinic to set up an appointment for evaluation and treatment.  YOU HAD AN ENDOSCOPIC PROCEDURE TODAY AT [facility]:   Refer to the procedure report that was given to you for any specific questions about what was found during the examination.  If the procedure report does not answer your questions, please call your gastroenterologist to clarify.  If you requested that your care partner not be given the details of your procedure findings, then the procedure report has been included in a sealed envelope for you to review at your convenience later.  YOU SHOULD EXPECT: Some feelings of bloating in the abdomen. Passage of more gas than usual.  Walking can help get rid of the air that was put into your GI tract during the procedure and reduce the bloating. If you had a lower endoscopy (such as a colonoscopy or flexible sigmoidoscopy) you may notice spotting of blood in your stool or on the toilet paper. If you underwent a bowel prep for your procedure, you may not have a normal bowel movement for a few days.  Please Note:  You might notice some irritation and congestion in your nose or some drainage.  This is from the oxygen used during your procedure.  There is no need for concern and it should clear up in a day or so.  SYMPTOMS TO REPORT IMMEDIATELY:   Following lower endoscopy (colonoscopy or flexible sigmoidoscopy):  Excessive amounts of blood in the stool  Significant tenderness or worsening of abdominal pains  Swelling of the abdomen that is new, acute  Fever of 100F or higher  For urgent or emergent issues, a gastroenterologist can  be reached at any hour by calling [phone]. Do not use MyChart messaging for urgent concerns.    DIET:  We do recommend a small meal at first, but then you may proceed to your regular diet.  Drink plenty of fluids but you should avoid alcoholic beverages for 24 hours.  ACTIVITY:  You should plan to take it easy for the rest of today and you should NOT DRIVE or use heavy machinery until tomorrow (because of the sedation medicines used during the test).    FOLLOW UP: Our staff will call the number listed on your records 48-72 hours following your procedure to check on you and address any questions or concerns that you may have regarding the information given to you following your procedure. If we do not reach you, we will leave a message.  We will attempt to reach you two times.  During this call, we will ask if you have developed any symptoms of COVID 19. If you develop any symptoms (ie: fever, flu-like symptoms, shortness of breath, cough etc.) before then, please call [phone].  If you test positive for Covid 19 in the 2 weeks post procedure, please call and report this information to [place].    If any biopsies were taken you will be contacted by phone or by letter within the next 1-3 weeks.  Please call us at [phone] if you have not heard about the biopsies in 3 weeks.    SIGNATURES/CONFIDENTIALITY: You and/or your care partner have signed paperwork which will  be entered into your electronic medical record.  These signatures attest to the fact that that the information above on your After Visit Summary has been reviewed and is understood.  Full responsibility of the confidentiality of this discharge information lies with you and/or your care-partner.

## 2020-07-04 ENCOUNTER — Telehealth: Payer: Self-pay

## 2020-07-04 NOTE — Telephone Encounter (Signed)
  Follow up Call-  Call back number 06/30/2020  Post procedure Call Back phone  # [phone]  Permission to leave phone message Yes  Some recent data might be hidden     Patient questions:  Do you have a fever, pain , or abdominal swelling? No. Pain Score  0 *  Have you tolerated food without any problems? Yes.    Have you been able to return to your normal activities? Yes.    Do you have any questions about your discharge instructions: Diet   No. Medications  No. Follow up visit  No.  Do you have questions or concerns about your Care? No.  Actions: * If pain score is 4 or above: No action needed, pain <4.  1. Have you developed a fever since your procedure? no  2.   Have you had an respiratory symptoms (SOB or cough) since your procedure? no  3.   Have you tested positive for COVID 19 since your procedure no  4.   Have you had any family members/close contacts diagnosed with the COVID 19 since your procedure?  no   If yes to any of these questions please route to Joylene John, RN and Joella Prince, RN

## 2020-07-06 ENCOUNTER — Encounter: Payer: Self-pay | Admitting: Gastroenterology

## 2020-07-25 DIAGNOSIS — J3489 Other specified disorders of nose and nasal sinuses: Secondary | ICD-10-CM | POA: Diagnosis not present

## 2020-07-25 DIAGNOSIS — I10 Essential (primary) hypertension: Secondary | ICD-10-CM | POA: Diagnosis not present

## 2020-09-18 DIAGNOSIS — I10 Essential (primary) hypertension: Secondary | ICD-10-CM | POA: Diagnosis not present

## 2020-11-02 DIAGNOSIS — Z711 Person with feared health complaint in whom no diagnosis is made: Secondary | ICD-10-CM | POA: Diagnosis not present

## 2020-11-02 DIAGNOSIS — I10 Essential (primary) hypertension: Secondary | ICD-10-CM | POA: Diagnosis not present

## 2020-11-15 ENCOUNTER — Other Ambulatory Visit: Payer: Self-pay

## 2020-11-15 ENCOUNTER — Encounter (HOSPITAL_BASED_OUTPATIENT_CLINIC_OR_DEPARTMENT_OTHER): Payer: Self-pay | Admitting: *Deleted

## 2020-11-15 DIAGNOSIS — Z79899 Other long term (current) drug therapy: Secondary | ICD-10-CM | POA: Insufficient documentation

## 2020-11-15 DIAGNOSIS — I10 Essential (primary) hypertension: Secondary | ICD-10-CM | POA: Insufficient documentation

## 2020-11-15 DIAGNOSIS — R030 Elevated blood-pressure reading, without diagnosis of hypertension: Secondary | ICD-10-CM | POA: Diagnosis present

## 2020-11-15 LAB — CBC WITH DIFFERENTIAL/PLATELET
Abs Immature Granulocytes: 0.02 10*3/uL (ref 0.00–0.07)
Basophils Absolute: 0.1 10*3/uL (ref 0.0–0.1)
Basophils Relative: 1 %
Eosinophils Absolute: 0.1 10*3/uL (ref 0.0–0.5)
Eosinophils Relative: 1 %
HCT: 41.1 % (ref 36.0–46.0)
Hemoglobin: 13.2 g/dL (ref 12.0–15.0)
Immature Granulocytes: 0 %
Lymphocytes Relative: 22 %
Lymphs Abs: 1.7 10*3/uL (ref 0.7–4.0)
MCH: 27 pg (ref 26.0–34.0)
MCHC: 32.1 g/dL (ref 30.0–36.0)
MCV: 84 fL (ref 80.0–100.0)
Monocytes Absolute: 0.7 10*3/uL (ref 0.1–1.0)
Monocytes Relative: 10 %
Neutro Abs: 4.8 10*3/uL (ref 1.7–7.7)
Neutrophils Relative %: 66 %
Platelets: 277 10*3/uL (ref 150–400)
RBC: 4.89 MIL/uL (ref 3.87–5.11)
RDW: 12.6 % (ref 11.5–15.5)
WBC: 7.4 10*3/uL (ref 4.0–10.5)
nRBC: 0 % (ref 0.0–0.2)

## 2020-11-15 LAB — COMPREHENSIVE METABOLIC PANEL
ALT: 19 U/L (ref 0–44)
AST: 15 U/L (ref 15–41)
Albumin: 4.3 g/dL (ref 3.5–5.0)
Alkaline Phosphatase: 98 U/L (ref 38–126)
Anion gap: 7 (ref 5–15)
BUN: 13 mg/dL (ref 8–23)
CO2: 27 mmol/L (ref 22–32)
Calcium: 9.4 mg/dL (ref 8.9–10.3)
Chloride: 104 mmol/L (ref 98–111)
Creatinine, Ser: 0.9 mg/dL (ref 0.44–1.00)
GFR, Estimated: >60 mL/min (ref >60)
Glucose, Bld: 133 mg/dL — ABNORMAL HIGH (ref 70–99)
Potassium: 3.6 mmol/L (ref 3.5–5.1)
Sodium: 138 mmol/L (ref 135–145)
Total Bilirubin: 0.4 mg/dL (ref 0.3–1.2)
Total Protein: 7.4 g/dL (ref 6.5–8.1)

## 2020-11-15 NOTE — ED Triage Notes (Signed)
C/o increased BP x 1 day 184/100 at home

## 2020-11-16 ENCOUNTER — Emergency Department (HOSPITAL_BASED_OUTPATIENT_CLINIC_OR_DEPARTMENT_OTHER)
Admission: EM | Admit: 2020-11-16 | Discharge: 2020-11-16 | Disposition: A | Discharge status: Home / Self Care | Payer: PPO | Attending: Emergency Medicine | Admitting: Emergency Medicine

## 2020-11-16 DIAGNOSIS — I10 Essential (primary) hypertension: Secondary | ICD-10-CM

## 2020-11-16 MED ORDER — SIMETHICONE 40 MG/0.6ML PO SUSP (UNIT DOSE): 40.0000 mg | Freq: Once | ORAL | Status: DC

## 2020-11-16 MED ORDER — AMLODIPINE BESYLATE 5 MG PO TABS: 10.0000 mg | ORAL_TABLET | Freq: Every day | ORAL | 0 refills | Status: DC

## 2020-11-16 NOTE — ED Provider Notes (Signed)
[facility] Provider Note: Georgena Spurling, MD, FACEP  CSN: [id number] MRN: [id number] ARRIVAL: 11/15/20 at 2203 ROOM: [place]  Hypertension   HISTORY OF PRESENT ILLNESS  11/16/20 1:09 AM Natasha Stewart is a 70 y.o. female with hypertension on amlodipine 5 mg daily and losartan 100 mg daily.  She takes her blood pressure daily and yesterday noticed that it was higher than usual (184/100.  On arrival here was not 192/100 initially and is now 178/98.  She denies any symptoms.  Specifically she denies headache, blurred vision, numbness, weakness, chest pain or shortness of breath.  The only change she knows of recently is that she was exposed to some chemical vapors at work about a week ago.  She she has no shortness of breath or cough.   Past Medical History:  Diagnosis Date   GERD (gastroesophageal reflux disease)    Hypertension    Sarcoidosis    per pt report    Past Surgical History:  Procedure Laterality Date   BREAST CYST ASPIRATION Left    COLONOSCOPY  2011    Family History  Problem Relation Age of Onset   Breast cancer Neg Hx    Colon cancer Neg Hx    Colon polyps Neg Hx    Esophageal cancer Neg Hx    Rectal cancer Neg Hx    Stomach cancer Neg Hx     Social History   Tobacco Use   Smoking status: Never   Smokeless tobacco: Never  Vaping Use   Vaping Use: Never used  Substance Use Topics   Alcohol use: Yes    Alcohol/week: 0.0 standard drinks    Comment: occasionally   Drug use: Never    Prior to Admission medications   Medication Sig Start Date End Date Taking? Authorizing Provider  amLODipine (NORVASC) 5 MG tablet Take by mouth. 06/22/20   [provider]  ascorbic acid (VITAMIN C) 1000 MG tablet Take by mouth.    [provider]  b complex vitamins tablet Take 1 tablet by mouth daily.    [provider]  cholecalciferol (VITAMIN D) 1000 UNITS tablet Take 1,000 Units by mouth daily. Take 1 tab  daily    [provider]  fluticasone (FLONASE) 50 MCG/ACT nasal spray 2 sprays by Each Nare route daily. 12/21/19   [provider]  losartan (COZAAR) 100 MG tablet Take 100 mg by mouth daily. 06/27/20   [provider]    Allergies Hctz [hydrochlorothiazide], Lisinopril, and Penicillin v   REVIEW OF SYSTEMS  Negative except as noted here or in the History of Present Illness.   PHYSICAL EXAMINATION  Initial Vital Signs Blood pressure (!) 192/100, pulse 71, temperature 98.6 F (37 C), temperature source Oral, resp. rate 18, height 5' 2.5" (1.588 m), weight 68.5 kg, SpO2 100 %.  Examination General: Well-developed, well-nourished female in no acute distress; appearance consistent with age of record HENT: normocephalic; atraumatic Eyes: pupils equal, round and reactive to light; extraocular muscles intact Neck: supple Heart: regular rate and rhythm Lungs: clear to auscultation bilaterally Abdomen: soft; nondistended; nontender; bowel sounds present Extremities: No deformity; full range of motion; pulses normal Neurologic: Awake, alert and oriented; motor function intact in all extremities and symmetric; no facial droop Skin: Warm and dry Psychiatric: Normal mood and affect   RESULTS  Summary of this visit's results, reviewed and interpreted by myself:   EKG Interpretation  Date/Time:    Ventricular Rate:  PR Interval:    QRS Duration:   QT Interval:    QTC Calculation:   R Axis:     Text Interpretation:          Laboratory Studies: Results for orders placed or performed during the hospital encounter of 11/16/20 (from the past 24 hour(s))  CBC with Differential     Status: None   Collection Time: 11/15/20 10:25 PM  Result Value Ref Range   WBC 7.4 4.0 - 10.5 K/uL   RBC 4.89 3.87 - 5.11 MIL/uL   Hemoglobin 13.2 12.0 - 15.0 g/dL   HCT 41.1 36.0 - 46.0 %   MCV 84.0 80.0 - 100.0 fL   MCH 27.0 26.0 - 34.0 pg   MCHC 32.1 30.0 - 36.0 g/dL    RDW 12.6 11.5 - 15.5 %   Platelets 277 150 - 400 K/uL   nRBC 0.0 0.0 - 0.2 %   Neutrophils Relative % 66 %   Neutro Abs 4.8 1.7 - 7.7 K/uL   Lymphocytes Relative 22 %   Lymphs Abs 1.7 0.7 - 4.0 K/uL   Monocytes Relative 10 %   Monocytes Absolute 0.7 0.1 - 1.0 K/uL   Eosinophils Relative 1 %   Eosinophils Absolute 0.1 0.0 - 0.5 K/uL   Basophils Relative 1 %   Basophils Absolute 0.1 0.0 - 0.1 K/uL   Immature Granulocytes 0 %   Abs Immature Granulocytes 0.02 0.00 - 0.07 K/uL  Comprehensive metabolic panel     Status: Abnormal   Collection Time: 11/15/20 10:25 PM  Result Value Ref Range   Sodium 138 135 - 145 mmol/L   Potassium 3.6 3.5 - 5.1 mmol/L   Chloride 104 98 - 111 mmol/L   CO2 27 22 - 32 mmol/L   Glucose, Bld 133 (H) 70 - 99 mg/dL   BUN 13 8 - 23 mg/dL   Creatinine, Ser 0.90 0.44 - 1.00 mg/dL   Calcium 9.4 8.9 - 10.3 mg/dL   Total Protein 7.4 6.5 - 8.1 g/dL   Albumin 4.3 3.5 - 5.0 g/dL   AST 15 15 - 41 U/L   ALT 19 0 - 44 U/L   Alkaline Phosphatase 98 38 - 126 U/L   Total Bilirubin 0.4 0.3 - 1.2 mg/dL   GFR, Estimated >60 >60 mL/min   Anion gap 7 5 - 15   Imaging Studies: No results found.  ED COURSE and MDM  Nursing notes, initial and subsequent vitals signs, including pulse oximetry, reviewed and interpreted by myself.  Vitals:   11/15/20 2220 11/15/20 2220 11/16/20 0021 11/16/20 0100  BP:  (!) 193/101 (!) 189/102 (!) 192/100  Pulse:  93 89 71  Resp:  18 18   Temp:  98.6 F (37 C)    TempSrc:  Oral    SpO2:  99% 98% 100%  Weight: 68.5 kg     Height: 5' 2.5" (1.588 m)      Medications - No data to display  There are no signs of endorgan failure to warrant emergent intervention.  The cause of her elevated blood pressure may be transient but she was advised to take a double dose of amlodipine pending follow-up with her PCP.  PROCEDURES  Procedures   ED DIAGNOSES     ICD-10-CM   1. Hypertension not at goal  I10         Shakeya Kerkman, Jenny Reichmann, MD 11/16/20  0126

## 2021-01-30 ENCOUNTER — Other Ambulatory Visit: Payer: Self-pay

## 2021-01-30 ENCOUNTER — Ambulatory Visit (INDEPENDENT_AMBULATORY_CARE_PROVIDER_SITE_OTHER): Payer: PPO | Admitting: Internal Medicine

## 2021-01-30 ENCOUNTER — Encounter: Payer: Self-pay | Admitting: Internal Medicine

## 2021-01-30 VITALS — BP 124/80 | HR 74 | Temp 97.9°F | Resp 18 | Ht 62.5 in | Wt 148.8 lb

## 2021-01-30 DIAGNOSIS — R7303 Prediabetes: Secondary | ICD-10-CM

## 2021-01-30 DIAGNOSIS — I10 Essential (primary) hypertension: Secondary | ICD-10-CM

## 2021-01-30 DIAGNOSIS — Z23 Encounter for immunization: Secondary | ICD-10-CM

## 2021-01-30 LAB — LIPID PANEL
Cholesterol: 209 mg/dL — ABNORMAL HIGH (ref 0–200)
HDL: 90.8 mg/dL (ref >39.00)
LDL Cholesterol: 98 mg/dL (ref 0–99)
NonHDL: 118.58
Total CHOL/HDL Ratio: 2
Triglycerides: 104 mg/dL (ref 0.0–149.0)
VLDL: 20.8 mg/dL (ref 0.0–40.0)

## 2021-01-30 LAB — HEMOGLOBIN A1C: Hgb A1c MFr Bld: 6.2 % (ref 4.6–6.5)

## 2021-01-30 MED ORDER — AMLODIPINE BESYLATE 5 MG PO TABS: 5.0000 mg | ORAL_TABLET | Freq: Every day | ORAL | 3 refills | Status: DC

## 2021-01-30 MED ORDER — LOSARTAN POTASSIUM 100 MG PO TABS: 100.0000 mg | ORAL_TABLET | Freq: Every day | ORAL | 3 refills | Status: DC

## 2021-01-30 NOTE — Progress Notes (Signed)
   Subjective:   Patient ID: Natasha Stewart, female    DOB: [birth year], 70 y.o.   MRN: [id number]  HPI The patient is a new 70 YO female coming in for ongoing medical care.  PMH, [facility], social history reviewed and updated  Review of Systems  Constitutional: Negative.   HENT: Negative.    Eyes: Negative.   Respiratory:  Negative for cough, chest tightness and shortness of breath.   Cardiovascular:  Negative for chest pain, palpitations and leg swelling.  Gastrointestinal:  Negative for abdominal distention, abdominal pain, constipation, diarrhea, nausea and vomiting.  Musculoskeletal: Negative.   Skin: Negative.   Neurological: Negative.   Psychiatric/Behavioral: Negative.     Objective:  Physical Exam Constitutional:      Appearance: She is well-developed.  HENT:     Head: Normocephalic and atraumatic.  Cardiovascular:     Rate and Rhythm: Normal rate and regular rhythm.  Pulmonary:     Effort: Pulmonary effort is normal. No respiratory distress.     Breath sounds: Normal breath sounds. No wheezing or rales.  Abdominal:     General: Bowel sounds are normal. There is no distension.     Palpations: Abdomen is soft.     Tenderness: There is no abdominal tenderness. There is no rebound.  Musculoskeletal:     Cervical back: Normal range of motion.  Skin:    General: Skin is warm and dry.  Neurological:     Mental Status: She is alert and oriented to person, place, and time.     Coordination: Coordination normal.    Vitals:   01/30/21 0927  BP: 124/80  Pulse: 74  Resp: 18  Temp: 97.9 F (36.6 C)  TempSrc: Oral  SpO2: 97%  Weight: 148 lb 12.8 oz (67.5 kg)  Height: 5' 2.5" (1.588 m)    This visit occurred during the SARS-CoV-2 public health emergency.  Safety protocols were in place, including screening questions prior to the visit, additional usage of staff PPE, and extensive cleaning of exam room while observing appropriate contact time as indicated for disinfecting  solutions.   Assessment & Plan:  Flu shot given at visit

## 2021-01-30 NOTE — Patient Instructions (Addendum)
We will check the labs today.  We have sent in refills of the blood pressure medicine so you have refills when you need them.

## 2021-01-31 ENCOUNTER — Telehealth: Payer: Self-pay | Admitting: Internal Medicine

## 2021-01-31 DIAGNOSIS — R7303 Prediabetes: Secondary | ICD-10-CM

## 2021-01-31 HISTORY — DX: Prediabetes: R73.03

## 2021-01-31 NOTE — Telephone Encounter (Signed)
Patient calling in after reviewing results from recent lab  Patient says she is willing to speak w/ nutritionist regarding her pre-sugar diabetes

## 2021-01-31 NOTE — Telephone Encounter (Signed)
See below

## 2021-02-01 DIAGNOSIS — I10 Essential (primary) hypertension: Secondary | ICD-10-CM

## 2021-02-01 HISTORY — DX: Essential (primary) hypertension: I10

## 2021-02-01 NOTE — Assessment & Plan Note (Signed)
Prior sugar noted to be elevated on labs so HgA1c ordered at visit to assess.

## 2021-02-01 NOTE — Assessment & Plan Note (Signed)
BP at goal on amlodipine 5 mg daily and losartan 100 mg daily. Checking CMP and CBC and lipid panel and adjust as needed. 

## 2021-02-19 DIAGNOSIS — Z01419 Encounter for gynecological examination (general) (routine) without abnormal findings: Secondary | ICD-10-CM | POA: Diagnosis not present

## 2021-03-05 ENCOUNTER — Ambulatory Visit: Payer: PPO | Attending: Internal Medicine

## 2021-03-05 DIAGNOSIS — Z23 Encounter for immunization: Secondary | ICD-10-CM

## 2021-04-02 ENCOUNTER — Other Ambulatory Visit (HOSPITAL_BASED_OUTPATIENT_CLINIC_OR_DEPARTMENT_OTHER): Payer: Self-pay

## 2021-04-02 MED ORDER — PFIZER COVID-19 VAC BIVALENT 30 MCG/0.3ML IM SUSP
INTRAMUSCULAR | 0 refills | Status: DC
  Filled 2021-04-02: qty 0.3, 1d supply, fill #0

## 2021-05-09 ENCOUNTER — Encounter: Payer: Self-pay | Admitting: Internal Medicine

## 2021-05-09 DIAGNOSIS — Z1231 Encounter for screening mammogram for malignant neoplasm of breast: Secondary | ICD-10-CM | POA: Diagnosis not present

## 2021-06-11 DIAGNOSIS — H9201 Otalgia, right ear: Secondary | ICD-10-CM | POA: Diagnosis not present

## 2021-06-11 DIAGNOSIS — M542 Cervicalgia: Secondary | ICD-10-CM | POA: Diagnosis not present

## 2021-06-14 DIAGNOSIS — H9201 Otalgia, right ear: Secondary | ICD-10-CM | POA: Diagnosis not present

## 2021-06-14 DIAGNOSIS — M542 Cervicalgia: Secondary | ICD-10-CM | POA: Diagnosis not present

## 2021-07-25 ENCOUNTER — Other Ambulatory Visit (HOSPITAL_COMMUNITY): Payer: Self-pay

## 2021-08-03 ENCOUNTER — Ambulatory Visit (INDEPENDENT_AMBULATORY_CARE_PROVIDER_SITE_OTHER): Payer: PPO | Admitting: Internal Medicine

## 2021-08-03 ENCOUNTER — Other Ambulatory Visit: Payer: Self-pay

## 2021-08-03 ENCOUNTER — Encounter: Payer: Self-pay | Admitting: Internal Medicine

## 2021-08-03 VITALS — BP 122/64 | HR 68 | Resp 18 | Ht 62.5 in | Wt 153.2 lb

## 2021-08-03 DIAGNOSIS — R109 Unspecified abdominal pain: Secondary | ICD-10-CM | POA: Insufficient documentation

## 2021-08-03 DIAGNOSIS — R7303 Prediabetes: Secondary | ICD-10-CM | POA: Diagnosis not present

## 2021-08-03 DIAGNOSIS — R1012 Left upper quadrant pain: Secondary | ICD-10-CM

## 2021-08-03 LAB — POCT GLYCOSYLATED HEMOGLOBIN (HGB A1C): Hemoglobin A1C: 5.9 % — AB (ref 4.0–5.6)

## 2021-08-03 NOTE — Assessment & Plan Note (Signed)
Could be muscular. She is not having any nausea/vomiting/diarrhea/constipation/blood in stool. No change in pain with eating. She does get pain with certain movements. Advised to use heat on the area or tylenol. No labs or imaging advised today.  ?

## 2021-08-03 NOTE — Patient Instructions (Addendum)
Your HgA1c today is 5.9 which is better ? ?Try heat or tylenol for the pain.  ? ? ?

## 2021-08-03 NOTE — Assessment & Plan Note (Signed)
POC HgA1c done today at 5.9 which is improved. She has made dietary changes and asked to sustain those. Continue monitoring HgA1c every 6-12 months. ?

## 2021-08-03 NOTE — Progress Notes (Signed)
? ?  Subjective:  ? ?Patient ID: Natasha Stewart, female    DOB: 1951-02-26, 71 y.o.   MRN: [id number] ? ?HPI ?The patient is a 71 YO female coming in for stomach pain and follow up. ? ?Review of Systems  ?Constitutional: Negative.   ?HENT: Negative.    ?Eyes: Negative.   ?Respiratory:  Negative for cough, chest tightness and shortness of breath.   ?Cardiovascular:  Negative for chest pain, palpitations and leg swelling.  ?Gastrointestinal:  Positive for abdominal pain. Negative for abdominal distention, constipation, diarrhea, nausea and vomiting.  ?Musculoskeletal: Negative.   ?Skin: Negative.   ?Neurological: Negative.   ?Psychiatric/Behavioral: Negative.    ? ?Objective:  ?Physical Exam ?Constitutional:   ?   Appearance: She is well-developed.  ?HENT:  ?   Head: Normocephalic and atraumatic.  ?Cardiovascular:  ?   Rate and Rhythm: Normal rate and regular rhythm.  ?Pulmonary:  ?   Effort: Pulmonary effort is normal. No respiratory distress.  ?   Breath sounds: Normal breath sounds. No wheezing or rales.  ?Abdominal:  ?   General: Bowel sounds are normal. There is no distension.  ?   Palpations: Abdomen is soft.  ?   Tenderness: There is abdominal tenderness. There is no rebound.  ?   Comments: Tenderness to palpation LUQ no rebound or guarding.  ?Musculoskeletal:  ?   Cervical back: Normal range of motion.  ?Skin: ?   General: Skin is warm and dry.  ?Neurological:  ?   Mental Status: She is alert and oriented to person, place, and time.  ?   Coordination: Coordination normal.  ? ? ?Vitals:  ? 08/03/21 0942  ?BP: 122/64  ?Pulse: 68  ?Resp: 18  ?SpO2: 98%  ?Weight: 153 lb 3.2 oz (69.5 kg)  ?Height: 5' 2.5" (1.588 m)  ? ? ?This visit occurred during the SARS-CoV-2 public health emergency.  Safety protocols were in place, including screening questions prior to the visit, additional usage of staff PPE, and extensive cleaning of exam room while observing appropriate contact time as indicated for disinfecting solutions.   ? ?Assessment & Plan:  ? ?

## 2022-01-22 ENCOUNTER — Other Ambulatory Visit: Payer: Self-pay

## 2022-01-22 ENCOUNTER — Telehealth: Payer: Self-pay

## 2022-01-22 MED ORDER — LOSARTAN POTASSIUM 100 MG PO TABS: 100.0000 mg | ORAL_TABLET | Freq: Every day | ORAL | 3 refills | Status: DC

## 2022-01-22 NOTE — Telephone Encounter (Signed)
Pt is requesting a refill on: losartan (COZAAR) 100 MG tablet  Pharmacy: [place], [place]  LOV 08/03/21 ROV 02/04/22

## 2022-02-04 ENCOUNTER — Ambulatory Visit (INDEPENDENT_AMBULATORY_CARE_PROVIDER_SITE_OTHER): Payer: PPO | Admitting: Internal Medicine

## 2022-02-04 ENCOUNTER — Encounter: Payer: Self-pay | Admitting: Internal Medicine

## 2022-02-04 VITALS — BP 138/86 | HR 74 | Ht 62.5 in | Wt 151.0 lb

## 2022-02-04 DIAGNOSIS — R7303 Prediabetes: Secondary | ICD-10-CM

## 2022-02-04 DIAGNOSIS — Z0000 Encounter for general adult medical examination without abnormal findings: Secondary | ICD-10-CM | POA: Diagnosis not present

## 2022-02-04 DIAGNOSIS — I10 Essential (primary) hypertension: Secondary | ICD-10-CM | POA: Diagnosis not present

## 2022-02-04 LAB — CBC
HCT: 38.6 % (ref 36.0–46.0)
Hemoglobin: 12.8 g/dL (ref 12.0–15.0)
MCHC: 33.1 g/dL (ref 30.0–36.0)
MCV: 82.2 fl (ref 78.0–100.0)
Platelets: 263 10*3/uL (ref 150.0–400.0)
RBC: 4.7 Mil/uL (ref 3.87–5.11)
RDW: 13.1 % (ref 11.5–15.5)
WBC: 6.5 10*3/uL (ref 4.0–10.5)

## 2022-02-04 LAB — COMPREHENSIVE METABOLIC PANEL
ALT: 13 U/L (ref 0–35)
AST: 12 U/L (ref 0–37)
Albumin: 4.2 g/dL (ref 3.5–5.2)
Alkaline Phosphatase: 103 U/L (ref 39–117)
BUN: 16 mg/dL (ref 6–23)
CO2: 27 mEq/L (ref 19–32)
Calcium: 9.6 mg/dL (ref 8.4–10.5)
Chloride: 103 mEq/L (ref 96–112)
Creatinine, Ser: 0.98 mg/dL (ref 0.40–1.20)
GFR: 58.55 mL/min — ABNORMAL LOW (ref >60.00)
Glucose, Bld: 88 mg/dL (ref 70–99)
Potassium: 3.8 mEq/L (ref 3.5–5.1)
Sodium: 140 mEq/L (ref 135–145)
Total Bilirubin: 0.5 mg/dL (ref 0.2–1.2)
Total Protein: 7.3 g/dL (ref 6.0–8.3)

## 2022-02-04 LAB — HEMOGLOBIN A1C: Hgb A1c MFr Bld: 6.2 % (ref 4.6–6.5)

## 2022-02-04 LAB — LIPID PANEL
Cholesterol: 233 mg/dL — ABNORMAL HIGH (ref 0–200)
HDL: 101.4 mg/dL (ref >39.00)
LDL Cholesterol: 113 mg/dL — ABNORMAL HIGH (ref 0–99)
NonHDL: 131.75
Total CHOL/HDL Ratio: 2
Triglycerides: 92 mg/dL (ref 0.0–149.0)
VLDL: 18.4 mg/dL (ref 0.0–40.0)

## 2022-02-04 MED ORDER — DICLOFENAC SODIUM 75 MG PO TBEC: 75.0000 mg | DELAYED_RELEASE_TABLET | ORAL | 3 refills | Status: DC | PRN

## 2022-02-04 MED ORDER — FLUTICASONE PROPIONATE 50 MCG/ACT NA SUSP: 2.0000 | Freq: Every day | NASAL | 3 refills | Status: DC

## 2022-02-04 NOTE — Progress Notes (Signed)
Subjective:   Patient ID: Natasha Stewart, female    DOB: [birth year], 71 y.o.   MRN: [id number]  HPI Here for medicare wellness and physical, no new complaints. Please see A/P for status and treatment of chronic medical problems.   Diet: heart healthy Physical activity: sedentary, walking Depression/mood screen: negative Hearing: intact to whispered voice, stable tinnitus Visual acuity: grossly normal with lens, performs annual eye exam  ADLs: capable Fall risk: none Home safety: good Cognitive evaluation: intact to orientation, naming, recall and repetition EOL planning: adv directives discussed, not in place  [employer] Visit from 02/04/2022 in [employer] at [facility] Visit from 02/04/2022 in [place] at [facility]  PHQ-9 Total Score 0         11/15/2020   10:22 PM 02/04/2022    2:06 PM 02/04/2022    2:10 PM  [place] in the past year?  0 0  Was there an injury with Fall?  0 0  Fall Risk Category Calculator  0 0  Fall Risk Category  Low Low  Patient Fall Risk Level Low fall risk      I have personally reviewed and have noted 1. The patient's medical and social history - reviewed today no changes 2. Their use of alcohol, tobacco or illicit drugs 3. Their current medications and supplements 4. The patient's functional ability including ADL's, fall risks, home safety risks and hearing or visual impairment. 5. Diet and physical activities 6. Evidence for depression or mood disorders 7. Care team reviewed and updated 8.  The patient is not on an opioid pain medication.  Patient Care Team: Hoyt Koch, MD as PCP - General (Internal Medicine) Past Medical History:  Diagnosis Date  . GERD (gastroesophageal reflux disease)   . Hypertension   . Sarcoidosis    per pt report   Past Surgical History:  Procedure Laterality Date  . BREAST CYST ASPIRATION Left   .  COLONOSCOPY  2011   Family History  Problem Relation Age of Onset  . Breast cancer Neg Hx   . Colon cancer Neg Hx   . Colon polyps Neg Hx   . Esophageal cancer Neg Hx   . Rectal cancer Neg Hx   . Stomach cancer Neg Hx    Review of Systems  Constitutional: Negative.   HENT:  Positive for tinnitus.   Eyes: Negative.   Respiratory:  Negative for cough, chest tightness and shortness of breath.   Cardiovascular:  Negative for chest pain, palpitations and leg swelling.  Gastrointestinal:  Negative for abdominal distention, abdominal pain, constipation, diarrhea, nausea and vomiting.  Musculoskeletal: Negative.   Skin: Negative.   Neurological: Negative.   Psychiatric/Behavioral: Negative.      Objective:  Physical Exam Constitutional:      Appearance: She is well-developed.  HENT:     Head: Normocephalic and atraumatic.  Cardiovascular:     Rate and Rhythm: Normal rate and regular rhythm.  Pulmonary:     Effort: Pulmonary effort is normal. No respiratory distress.     Breath sounds: Normal breath sounds. No wheezing or rales.  Abdominal:     General: Bowel sounds are normal. There is no distension.     Palpations: Abdomen is soft.     Tenderness: There is no abdominal tenderness. There is no rebound.  Musculoskeletal:     Cervical back: Normal range of motion.  Skin:    General: Skin is warm and dry.  Neurological:     Mental Status: She is alert and oriented to person, place, and time.     Coordination: Coordination normal.   Vitals:   02/04/22 1403  BP: 138/86  Pulse: 74  SpO2: 99%  Weight: 151 lb (68.5 kg)  Height: 5' 2.5" (1.588 m)    Assessment & Plan:

## 2022-02-07 DIAGNOSIS — Z0000 Encounter for general adult medical examination without abnormal findings: Secondary | ICD-10-CM | POA: Insufficient documentation

## 2022-02-07 HISTORY — DX: Encounter for general adult medical examination without abnormal findings: Z00.00

## 2022-02-07 NOTE — Assessment & Plan Note (Signed)
BP at goal on amlodipine 5 mg daily and losartan 100 mg daily. Checking CMP and adjust as needed.

## 2022-02-07 NOTE — Assessment & Plan Note (Signed)
Flu shot yearly. Covid-19 counseled. Pneumonia complete. Shingrix due at pharmacy. Tetanus due 2024. Colonoscopy due 2025. Mammogram due 2024, pap smear aged out and dexa complete. Counseled about sun safety and mole surveillance. Counseled about the dangers of distracted driving. Given 10 year screening recommendations.

## 2022-02-07 NOTE — Assessment & Plan Note (Signed)
Checking HgA1c and adjust as needed.  

## 2022-04-22 ENCOUNTER — Other Ambulatory Visit: Payer: Self-pay

## 2022-04-22 ENCOUNTER — Telehealth: Payer: Self-pay | Admitting: Internal Medicine

## 2022-04-22 MED ORDER — AMLODIPINE BESYLATE 5 MG PO TABS: 5.0000 mg | ORAL_TABLET | Freq: Every day | ORAL | 3 refills | Status: DC

## 2022-04-22 MED ORDER — LOSARTAN POTASSIUM 100 MG PO TABS: 100.0000 mg | ORAL_TABLET | Freq: Every day | ORAL | 3 refills | Status: DC

## 2022-04-22 NOTE — Telephone Encounter (Signed)
Patient needs her losartan and her amlodopine refilled - Please send to [facility] on [address] in [place] appt. 01/2022

## 2022-04-22 NOTE — Telephone Encounter (Signed)
Sent in to pts pharmacy.

## 2022-05-22 DIAGNOSIS — Z1231 Encounter for screening mammogram for malignant neoplasm of breast: Secondary | ICD-10-CM | POA: Diagnosis not present

## 2022-05-23 LAB — HM MAMMOGRAPHY

## 2022-07-22 ENCOUNTER — Encounter: Payer: Self-pay | Admitting: Internal Medicine

## 2022-07-22 ENCOUNTER — Ambulatory Visit (INDEPENDENT_AMBULATORY_CARE_PROVIDER_SITE_OTHER): Payer: PPO | Admitting: Internal Medicine

## 2022-07-22 VITALS — BP 118/80 | HR 71 | Temp 98.4°F | Ht 62.5 in | Wt 151.0 lb

## 2022-07-22 DIAGNOSIS — I10 Essential (primary) hypertension: Secondary | ICD-10-CM | POA: Diagnosis not present

## 2022-07-22 DIAGNOSIS — R7303 Prediabetes: Secondary | ICD-10-CM | POA: Diagnosis not present

## 2022-07-22 LAB — POCT GLYCOSYLATED HEMOGLOBIN (HGB A1C): HbA1c POC (<> result, manual entry): 5.6 % (ref 4.0–5.6)

## 2022-07-22 NOTE — Assessment & Plan Note (Addendum)
EKG done today to establish baseline which is normal. BP at goal on amlodipine 5 mg daily and losartan 100 mg daily. Recent labs today no labs indicated today. Continue meds at current dose.

## 2022-07-22 NOTE — Assessment & Plan Note (Addendum)
Checking POC HgA1c today which is improved to 5.6. We will continue to monitor at 6 months and if stable will move to yearly follow up. She has made dietary changes.

## 2022-07-22 NOTE — Progress Notes (Signed)
   Subjective:   Patient ID: Natasha Stewart, female    DOB: 06/04/1950, 72 y.o.   MRN: [id number]  HPI The patient is a 72 YO female coming in for follow up.   Review of Systems  Constitutional: Negative.   HENT: Negative.    Eyes: Negative.   Respiratory:  Negative for cough, chest tightness and shortness of breath.   Cardiovascular:  Negative for chest pain, palpitations and leg swelling.  Gastrointestinal:  Negative for abdominal distention, abdominal pain, constipation, diarrhea, nausea and vomiting.  Musculoskeletal: Negative.   Skin: Negative.   Neurological: Negative.   Psychiatric/Behavioral: Negative.      Objective:  Physical Exam Constitutional:      Appearance: She is well-developed.  HENT:     Head: Normocephalic and atraumatic.  Cardiovascular:     Rate and Rhythm: Normal rate and regular rhythm.  Pulmonary:     Effort: Pulmonary effort is normal. No respiratory distress.     Breath sounds: Normal breath sounds. No wheezing or rales.  Abdominal:     General: Bowel sounds are normal. There is no distension.     Palpations: Abdomen is soft.     Tenderness: There is no abdominal tenderness. There is no rebound.  Musculoskeletal:     Cervical back: Normal range of motion.  Skin:    General: Skin is warm and dry.  Neurological:     Mental Status: She is alert and oriented to person, place, and time.     Coordination: Coordination normal.     Vitals:   07/22/22 1107  BP: 118/80  Pulse: 71  Temp: 98.4 F (36.9 C)  TempSrc: Oral  SpO2: 99%  Weight: 151 lb (68.5 kg)  Height: 5' 2.5" (1.588 m)    EKG: Rate 67, axis normal, interval normal, sinus, no st or t wave changes, no prior to compare   Assessment & Plan:

## 2022-09-30 ENCOUNTER — Telehealth: Payer: Self-pay | Admitting: Internal Medicine

## 2022-09-30 NOTE — Telephone Encounter (Signed)
Contacted Natasha Stewart to schedule their annual wellness visit. Appointment made for 10/15/2022.  [facility] Care Guide [facility] AWV TEAM Direct Dial: [phone]

## 2022-10-15 ENCOUNTER — Ambulatory Visit (INDEPENDENT_AMBULATORY_CARE_PROVIDER_SITE_OTHER): Payer: PPO

## 2022-10-15 VITALS — Wt 151.0 lb

## 2022-10-15 DIAGNOSIS — Z0000 Encounter for general adult medical examination without abnormal findings: Secondary | ICD-10-CM | POA: Diagnosis not present

## 2022-10-15 NOTE — Progress Notes (Signed)
Subjective:   Natasha Stewart is a 72 y.o. female who presents for Medicare Annual (Subsequent) preventive examination.  Review of Systems    I connected with  Natasha Stewart on 10/15/22 by a audio enabled telemedicine application and verified that I am speaking with the correct person using two identifiers.  Patient Medicare AWV questionnaire was completed by the patient on 10/14/22; I have confirmed that all information answered by patient is correct and no changes since this date.     Patient Location: Home  Provider Location: Home Office  I discussed the limitations of evaluation and management by telemedicine. The patient expressed understanding and agreed to proceed.  Cardiac Risk Factors include: hypertension     Objective:    Today's Vitals   10/15/22 1501  Weight: 151 lb (68.5 kg)   Body mass index is 27.18 kg/m.     10/15/2022    3:08 PM  Advanced Directives  Does Patient Have a Medical Advance Directive? Yes  Type of Estate agent of [place];Living will  Copy of Healthcare Power of Attorney in Chart? No - copy requested    Current Medications (verified) Outpatient Encounter Medications as of 10/15/2022  Medication Sig   amLODipine (NORVASC) 5 MG tablet Take 1 tablet (5 mg total) by mouth daily.   ascorbic acid (VITAMIN C) 1000 MG tablet Take by mouth.   b complex vitamins tablet Take 1 tablet by mouth daily.   cholecalciferol (VITAMIN D) 1000 UNITS tablet Take 1,000 Units by mouth daily. Take 1 tab daily   diclofenac (VOLTAREN) 75 MG EC tablet Take 1 tablet (75 mg total) by mouth as needed.   fluticasone (FLONASE) 50 MCG/ACT nasal spray Place 2 sprays into both nostrils daily.   losartan (COZAAR) 100 MG tablet Take 1 tablet (100 mg total) by mouth daily.   No facility-administered encounter medications on file as of 10/15/2022.    Allergies (verified) Hctz [hydrochlorothiazide], Lisinopril, and Penicillin v   History: Past Medical  History:  Diagnosis Date   GERD (gastroesophageal reflux disease)    Hypertension    Sarcoidosis    per pt report   Past Surgical History:  Procedure Laterality Date   BREAST CYST ASPIRATION Left    COLONOSCOPY  2011   Family History  Problem Relation Age of Onset   Breast cancer Neg Hx    Colon cancer Neg Hx    Colon polyps Neg Hx    Esophageal cancer Neg Hx    Rectal cancer Neg Hx    Stomach cancer Neg Hx    Social History   Socioeconomic History   Marital status: Divorced    Spouse name: Not on file   Number of children: Not on file   Years of education: Not on file   Highest education level: Not on file  Occupational History   Not on file  Tobacco Use   Smoking status: Never   Smokeless tobacco: Never  Vaping Use   Vaping Use: Never used  Substance and Sexual Activity   Alcohol use: Yes    Alcohol/week: 0.0 standard drinks of alcohol    Comment: occasionally   Drug use: Never   Sexual activity: Not on file  Other Topics Concern   Not on file  Social History Narrative   Tobacco use cigarettes : Never smoked ,Tobacco history last updated 06/23/2013   No smoking Alcohol 1-2 per week. Caffeine 1-2 servings daily coffee, tea    Diet : low fat.Excercise : walks or  weights 3-4 times  Per week.   Social Determinants of Health   Financial Resource Strain: Low Risk  (10/15/2022)   Overall Financial Resource Strain (CARDIA)    Difficulty of Paying Living Expenses: Not hard at all  Food Insecurity: No Food Insecurity (10/15/2022)   Hunger Vital Sign    Worried About Running Out of Food in the Last Year: Never true    Ran Out of Food in the Last Year: Never true  Transportation Needs: No Transportation Needs (10/15/2022)   PRAPARE - Administrator, Civil Service (Medical): No    Lack of Transportation (Non-Medical): No  Physical Activity: Sufficiently Active (10/15/2022)   Exercise Vital Sign    Days of Exercise per Week: 7 days    Minutes of Exercise  per Session: 30 min  Stress: No Stress Concern Present (10/15/2022)   [employer] of Occupational Health - Occupational Stress Questionnaire    Feeling of Stress : Not at all  Social Connections: Socially Integrated (10/15/2022)   Social Connection and Isolation Panel [NHANES]    Frequency of Communication with Friends and Family: More than three times a week    Frequency of Social Gatherings with Friends and Family: More than three times a week    Attends Religious Services: More than 4 times per year    Active Member of [employer] or Organizations: Yes    Attends Engineer, structural: More than 4 times per year    Marital Status: Married    Tobacco Counseling Counseling given: Yes   Clinical Intake:  Pre-visit preparation completed: Yes  Pain : No/denies pain     BMI - recorded: 27.18 Nutritional Status: BMI 25 -29 Overweight Nutritional Risks: None Diabetes: No  How often do you need to have someone help you when you read instructions, pamphlets, or other written materials from your doctor or pharmacy?: 1 - Never  Diabetic?no  Interpreter Needed?: No  Information entered by :: Natasha Stewart ,CMA   Activities of Daily Living    10/14/2022    6:45 PM  In your present state of health, do you have any difficulty performing the following activities:  Hearing? 0  Vision? 0  Difficulty concentrating or making decisions? 0  Walking or climbing stairs? 0  Dressing or bathing? 0  Doing errands, shopping? 0  Preparing Food and eating ? N  Using the Toilet? N  In the past six months, have you accidently leaked urine? N  Do you have problems with loss of bowel control? N  Managing your Medications? N  Managing your Finances? N  Housekeeping or managing your Housekeeping? N    Patient Care Team: Natasha Broker, MD as PCP - General (Internal Medicine) Stewart, Natasha Chalet, MD as Referring Physician (Obstetrics and Gynecology) Susa Raring., MD  as Referring Physician (Sports Medicine)  Indicate any recent Medical Services you may have received from other than [facility] providers in the past year (date may be approximate).     Assessment:   This is a routine wellness examination for Natasha Stewart.  Hearing/Vision screen Hearing Screening - Comments:: Denies hearing difficulties   Vision Screening - Comments:: Wears rx glasses - up to date with routine eye exams with  [facility]   Dietary issues and exercise activities discussed: Current Exercise Habits: Structured exercise class, Type of exercise: walking, Time (Minutes): 40, Frequency (Times/Week): 3, Weekly Exercise (Minutes/Week): 120, Intensity: Moderate, Exercise limited by: orthopedic condition(s) (back)   Goals Addressed  None   Depression Screen    10/15/2022    3:05 PM 07/22/2022   11:08 AM 02/04/2022    2:09 PM 02/04/2022    2:05 PM 01/30/2021    9:28 AM  PHQ 2/9 Scores  PHQ - 2 Score 0 0 0 0 0  PHQ- 9 Score  0 0 0     Fall Risk    10/14/2022    6:45 PM 07/22/2022   11:08 AM 02/04/2022    2:10 PM 02/04/2022    2:06 PM  Fall Risk   Falls in the past year? 0 0 0 0  Number falls in past yr:  0 0 0  Injury with Fall? 0 0 0 0  Follow up Falls prevention discussed;Falls evaluation completed Falls evaluation completed      FALL RISK PREVENTION PERTAINING TO THE HOME:  Any stairs in or around the home? Yes  If so, are there any without handrails? No  Home free of loose throw rugs in walkways, pet beds, electrical cords, etc? No  Adequate lighting in your home to reduce risk of falls? Yes   ASSISTIVE DEVICES UTILIZED TO PREVENT FALLS:  Life alert? No  Use of a cane, walker or w/c? Yes  Grab bars in the bathroom? No  Shower chair or bench in shower? No  Elevated toilet seat or a handicapped toilet? No   TIMED UP AND GO:  Was the test performed?  No televist  .    Cognitive Function:        10/15/2022    3:05 PM  6CIT Screen  What Year? 0 points  What month?  0 points  What time? 0 points  Count back from 20 0 points  Months in reverse 0 points  Repeat phrase 0 points  Total Score 0 points    Immunizations Immunization History  Administered Date(s) Administered   DTaP 10/09/2022   Fluad Quad(high Dose 65+) 01/30/2021   Influenza, High Dose Seasonal PF 02/12/2019, 03/04/2020   PFIZER(Purple Top)SARS-COV-2 Vaccination 07/11/2019, 08/04/2019   Pfizer Covid-19 Vaccine Bivalent Booster 49yrs & up 03/05/2021, 03/05/2021   Pneumococcal Conjugate-13 11/25/2017   Pneumococcal Polysaccharide-23 12/11/2018   Tdap 09/30/2012    TDAP status: Up to date  Flu Vaccine status: Up to date  Pneumococcal vaccine status: Up to date  Covid-19 vaccine status: Completed vaccines  Qualifies for Shingles Vaccine? Yes   Zostavax completed Yes   Shingrix Completed?: Yes  Screening Tests Health Maintenance  Topic Date Due   Hepatitis C Screening  Never done   Zoster Vaccines- Shingrix (1 of 2) Never done   DEXA SCAN  10/15/2023 (Originally 10/02/2016)   COVID-19 Vaccine (4 - 2023-24 season) 10/31/2023 (Originally 01/18/2022)   INFLUENZA VACCINE  12/19/2022   Colonoscopy  07/01/2023   Medicare Annual Wellness (AWV)  10/15/2023   MAMMOGRAM  05/23/2024   DTaP/Tdap/Td (3 - Td or Tdap) 10/08/2032   Pneumonia Vaccine 56+ Years old  Completed   HPV VACCINES  Aged Out    Health Maintenance  Health Maintenance Due  Topic Date Due   Hepatitis C Screening  Never done   Zoster Vaccines- Shingrix (1 of 2) Never done    Colorectal cancer screening: Type of screening: Colonoscopy. Completed 06/30/20. Repeat every 5 years  Mammogram status: Completed 05/23/22. Repeat every year  Bone density Patient Declined  Lung Cancer Screening: (Low Dose CT Chest recommended if Age 74-80 years, 30 pack-year currently smoking OR have quit w/in 15years.) does not qualify.  Lung Cancer Screening Referral: no  Additional Screening:  Hepatitis C Screening: does  qualify; Will have at lab appointment  Vision Screening: Recommended annual ophthalmology exams for early detection of glaucoma and other disorders of the eye. Is the patient up to date with their annual eye exam?  Yes  Who is the provider or what is the name of the office in which the patient attends annual eye exams? [facility]  If pt is not established with a provider, would they like to be referred to a provider to establish care? No .   Dental Screening: Recommended annual dental exams for proper oral hygiene  Community Resource Referral / Chronic Care Management: CRR required this visit?  No   CCM required this visit?  No      Plan:     I have personally reviewed and noted the following in the patient's chart:   Medical and social history Use of alcohol, tobacco or illicit drugs  Current medications and supplements including opioid prescriptions. Patient is not currently taking opioid prescriptions. Functional ability and status Nutritional status Physical activity Advanced directives List of other physicians Hospitalizations, surgeries, and ER visits in previous 12 months Vitals Screenings to include cognitive, depression, and falls Referrals and appointments  In addition, I have reviewed and discussed with patient certain preventive protocols, quality metrics, and best practice recommendations. A written personalized care plan for preventive services as well as general preventive health recommendations were provided to patient.     Natasha Stewart, CMA   10/15/2022   Nurse Notes: Patient did have shingles vaccines done she will send a Mychart message regarding the dates.

## 2022-10-15 NOTE — Patient Instructions (Signed)
[name] , Thank you for taking time to come for your Medicare Wellness Visit. I appreciate your ongoing commitment to your health goals. Please review the following plan we discussed and let me know if I can assist you in the future.   These are the goals we discussed:  Goals   None     This is a list of the screening recommended for you and due dates:  Health Maintenance  Topic Date Due   Hepatitis C Screening  Never done   Zoster (Shingles) Vaccine (1 of 2) Never done   DEXA scan (bone density measurement)  10/15/2023*   COVID-19 Vaccine (4 - 2023-24 season) 10/31/2023*   Flu Shot  12/19/2022   Colon Cancer Screening  07/01/2023   Medicare Annual Wellness Visit  10/15/2023   Mammogram  05/23/2024   DTaP/Tdap/Td vaccine (3 - Td or Tdap) 10/08/2032   Pneumonia Vaccine  Completed   HPV Vaccine  Aged Out  *Topic was postponed. The date shown is not the original due date.    Advanced directives: Please bring a copy of your health care power of attorney and living will to the office to be added to your chart at your convenience.   Conditions/risks identified: Aim for 30 minutes of exercise or brisk walking, 6-8 glasses of water, and 5 servings of fruits and vegetables each day.   Next appointment: Follow up in one year for your annual wellness visit 10/16/23   Preventive Care 65 Years and Older, Female Preventive care refers to lifestyle choices and visits with your health care provider that can promote health and wellness. What does preventive care include? A yearly physical exam. This is also called an annual well check. Dental exams once or twice a year. Routine eye exams. Ask your health care provider how often you should have your eyes checked. Personal lifestyle choices, including: Daily care of your teeth and gums. Regular physical activity. Eating a healthy diet. Avoiding tobacco and drug use. Limiting alcohol use. Practicing safe sex. Taking low-dose aspirin  every day. Taking vitamin and mineral supplements as recommended by your health care provider. What happens during an annual well check? The services and screenings done by your health care provider during your annual well check will depend on your age, overall health, lifestyle risk factors, and family history of disease. Counseling  Your health care provider may ask you questions about your: Alcohol use. Tobacco use. Drug use. Emotional well-being. Home and relationship well-being. Sexual activity. Eating habits. History of falls. Memory and ability to understand (cognition). Work and work Astronomer. Reproductive health. Screening  You may have the following tests or measurements: Height, weight, and BMI. Blood pressure. Lipid and cholesterol levels. These may be checked every 5 years, or more frequently if you are over 85 years old. Skin check. Lung cancer screening. You may have this screening every year starting at age 81 if you have a 30-pack-year history of smoking and currently smoke or have quit within the past 15 years. Fecal occult blood test (FOBT) of the stool. You may have this test every year starting at age 1. Flexible sigmoidoscopy or colonoscopy. You may have a sigmoidoscopy every 5 years or a colonoscopy every 10 years starting at age 41. Hepatitis C blood test. Hepatitis B blood test. Sexually transmitted disease (STD) testing. Diabetes screening. This is done by checking your blood sugar (glucose) after you have not eaten for a while (fasting). You may have this done every 1-3 years. Bone  density scan. This is done to screen for osteoporosis. You may have this done starting at age 52. Mammogram. This may be done every 1-2 years. Talk to your health care provider about how often you should have regular mammograms. Talk with your health care provider about your test results, treatment options, and if necessary, the need for more tests. Vaccines  Your health  care provider may recommend certain vaccines, such as: Influenza vaccine. This is recommended every year. Tetanus, diphtheria, and acellular pertussis (Tdap, Td) vaccine. You may need a Td booster every 10 years. Zoster vaccine. You may need this after age 13. Pneumococcal 13-valent conjugate (PCV13) vaccine. One dose is recommended after age 66. Pneumococcal polysaccharide (PPSV23) vaccine. One dose is recommended after age 7. Talk to your health care provider about which screenings and vaccines you need and how often you need them. This information is not intended to replace advice given to you by your health care provider. Make sure you discuss any questions you have with your health care provider. Document Released: 06/02/2015 Document Revised: 01/24/2016 Document Reviewed: 03/07/2015 Elsevier Interactive Patient Education  2017 [employer].  Fall Prevention in the Home Falls can cause injuries. They can happen to people of all ages. There are many things you can do to make your home safe and to help prevent falls. What can I do on the outside of my home? Regularly fix the edges of walkways and driveways and fix any cracks. Remove anything that might make you trip as you walk through a door, such as a raised step or threshold. Trim any bushes or trees on the path to your home. Use bright outdoor lighting. Clear any walking paths of anything that might make someone trip, such as rocks or tools. Regularly check to see if handrails are loose or broken. Make sure that both sides of any steps have handrails. Any raised decks and porches should have guardrails on the edges. Have any leaves, snow, or ice cleared regularly. Use sand or salt on walking paths during winter. Clean up any spills in your garage right away. This includes oil or grease spills. What can I do in the bathroom? Use night lights. Install grab bars by the toilet and in the tub and shower. Do not use towel bars as grab  bars. Use non-skid mats or decals in the tub or shower. If you need to sit down in the shower, use a plastic, non-slip stool. Keep the floor dry. Clean up any water that spills on the floor as soon as it happens. Remove soap buildup in the tub or shower regularly. Attach bath mats securely with double-sided non-slip rug tape. Do not have throw rugs and other things on the floor that can make you trip. What can I do in the bedroom? Use night lights. Make sure that you have a light by your bed that is easy to reach. Do not use any sheets or blankets that are too big for your bed. They should not hang down onto the floor. Have a firm chair that has side arms. You can use this for support while you get dressed. Do not have throw rugs and other things on the floor that can make you trip. What can I do in the kitchen? Clean up any spills right away. Avoid walking on wet floors. Keep items that you use a lot in easy-to-reach places. If you need to reach something above you, use a strong step stool that has a grab bar. Keep  electrical cords out of the way. Do not use floor polish or wax that makes floors slippery. If you must use wax, use non-skid floor wax. Do not have throw rugs and other things on the floor that can make you trip. What can I do with my stairs? Do not leave any items on the stairs. Make sure that there are handrails on both sides of the stairs and use them. Fix handrails that are broken or loose. Make sure that handrails are as long as the stairways. Check any carpeting to make sure that it is firmly attached to the stairs. Fix any carpet that is loose or worn. Avoid having throw rugs at the top or bottom of the stairs. If you do have throw rugs, attach them to the floor with carpet tape. Make sure that you have a light switch at the top of the stairs and the bottom of the stairs. If you do not have them, ask someone to add them for you. What else can I do to help prevent  falls? Wear shoes that: Do not have high heels. Have rubber bottoms. Are comfortable and fit you well. Are closed at the toe. Do not wear sandals. If you use a stepladder: Make sure that it is fully opened. Do not climb a closed stepladder. Make sure that both sides of the stepladder are locked into place. Ask someone to hold it for you, if possible. Clearly mark and make sure that you can see: Any grab bars or handrails. First and last steps. Where the edge of each step is. Use tools that help you move around (mobility aids) if they are needed. These include: Canes. Walkers. Scooters. Crutches. Turn on the lights when you go into a dark area. Replace any light bulbs as soon as they burn out. Set up your furniture so you have a clear path. Avoid moving your furniture around. If any of your floors are uneven, fix them. If there are any pets around you, be aware of where they are. Review your medicines with your doctor. Some medicines can make you feel dizzy. This can increase your chance of falling. Ask your doctor what other things that you can do to help prevent falls. This information is not intended to replace advice given to you by your health care provider. Make sure you discuss any questions you have with your health care provider. Document Released: 03/02/2009 Document Revised: 10/12/2015 Document Reviewed: 06/10/2014 Elsevier Interactive Patient Education  2017 [employer].

## 2022-11-28 DIAGNOSIS — H40053 Ocular hypertension, bilateral: Secondary | ICD-10-CM | POA: Diagnosis not present

## 2022-11-28 DIAGNOSIS — H25813 Combined forms of age-related cataract, bilateral: Secondary | ICD-10-CM | POA: Diagnosis not present

## 2022-11-28 DIAGNOSIS — H04123 Dry eye syndrome of bilateral lacrimal glands: Secondary | ICD-10-CM | POA: Diagnosis not present

## 2022-11-28 DIAGNOSIS — H524 Presbyopia: Secondary | ICD-10-CM | POA: Diagnosis not present

## 2022-11-28 DIAGNOSIS — H40023 Open angle with borderline findings, high risk, bilateral: Secondary | ICD-10-CM | POA: Diagnosis not present

## 2022-12-09 ENCOUNTER — Telehealth: Payer: Self-pay | Admitting: *Deleted

## 2022-12-09 NOTE — Telephone Encounter (Signed)
I connected with [facility] on 7/22 at 0957 by telephone and verified that I am speaking with the correct person using two identifiers. According to the patient's chart they are due for follow up  with [facility]. Pt scheduled. There are no transportation issues at this time. Nothing further was needed at the end of our conversation.

## 2023-01-02 ENCOUNTER — Encounter (INDEPENDENT_AMBULATORY_CARE_PROVIDER_SITE_OTHER): Payer: Self-pay

## 2023-01-28 ENCOUNTER — Telehealth: Payer: Self-pay | Admitting: Internal Medicine

## 2023-01-28 ENCOUNTER — Ambulatory Visit (INDEPENDENT_AMBULATORY_CARE_PROVIDER_SITE_OTHER): Payer: PPO | Admitting: Internal Medicine

## 2023-01-28 ENCOUNTER — Encounter: Payer: Self-pay | Admitting: Internal Medicine

## 2023-01-28 VITALS — BP 140/90 | HR 81 | Temp 98.6°F | Ht 62.5 in | Wt 143.0 lb

## 2023-01-28 DIAGNOSIS — Z23 Encounter for immunization: Secondary | ICD-10-CM

## 2023-01-28 DIAGNOSIS — I10 Essential (primary) hypertension: Secondary | ICD-10-CM | POA: Diagnosis not present

## 2023-01-28 DIAGNOSIS — R7303 Prediabetes: Secondary | ICD-10-CM | POA: Diagnosis not present

## 2023-01-28 LAB — CBC
HCT: 41.9 % (ref 36.0–46.0)
Hemoglobin: 13.3 g/dL (ref 12.0–15.0)
MCHC: 31.8 g/dL (ref 30.0–36.0)
MCV: 82 fl (ref 78.0–100.0)
Platelets: 293 10*3/uL (ref 150.0–400.0)
RBC: 5.11 Mil/uL (ref 3.87–5.11)
RDW: 13.3 % (ref 11.5–15.5)
WBC: 7.2 10*3/uL (ref 4.0–10.5)

## 2023-01-28 LAB — LIPID PANEL
Cholesterol: 231 mg/dL — ABNORMAL HIGH (ref 0–200)
HDL: 109.4 mg/dL (ref >39.00)
LDL Cholesterol: 104 mg/dL — ABNORMAL HIGH (ref 0–99)
NonHDL: 121.53
Total CHOL/HDL Ratio: 2
Triglycerides: 87 mg/dL (ref 0.0–149.0)
VLDL: 17.4 mg/dL (ref 0.0–40.0)

## 2023-01-28 LAB — COMPREHENSIVE METABOLIC PANEL
ALT: 15 U/L (ref 0–35)
AST: 16 U/L (ref 0–37)
Albumin: 4.2 g/dL (ref 3.5–5.2)
Alkaline Phosphatase: 113 U/L (ref 39–117)
BUN: 16 mg/dL (ref 6–23)
CO2: 29 meq/L (ref 19–32)
Calcium: 9.6 mg/dL (ref 8.4–10.5)
Chloride: 102 meq/L (ref 96–112)
Creatinine, Ser: 0.94 mg/dL (ref 0.40–1.20)
GFR: 61.13 mL/min (ref >60.00)
Glucose, Bld: 98 mg/dL (ref 70–99)
Potassium: 4.1 meq/L (ref 3.5–5.1)
Sodium: 139 meq/L (ref 135–145)
Total Bilirubin: 0.4 mg/dL (ref 0.2–1.2)
Total Protein: 7.3 g/dL (ref 6.0–8.3)

## 2023-01-28 LAB — HEMOGLOBIN A1C: Hgb A1c MFr Bld: 6.3 % (ref 4.6–6.5)

## 2023-01-28 NOTE — Assessment & Plan Note (Signed)
Referral to cardiology done and checking lipid panel and CMP and CBC today. Adjust amlodipine 5 mg daily and losartan 100 mg daily.

## 2023-01-28 NOTE — Patient Instructions (Signed)
We will get you in with the heart doctor in [place].

## 2023-01-28 NOTE — Assessment & Plan Note (Signed)
Checking HgA1c and adjust as needed.  

## 2023-01-28 NOTE — Progress Notes (Signed)
   Subjective:   Patient ID: Natasha Stewart, female    DOB: 25-Sep-1950, 72 y.o.   MRN: [id number]  HPI The patient is a 72 YO female coming in for follow up. Would like referral to cardiology for assessment of heart condition with risk factor of age, gender, htn  Review of Systems  Constitutional: Negative.   HENT: Negative.    Eyes: Negative.   Respiratory:  Negative for cough, chest tightness and shortness of breath.   Cardiovascular:  Negative for chest pain, palpitations and leg swelling.  Gastrointestinal:  Negative for abdominal distention, abdominal pain, constipation, diarrhea, nausea and vomiting.  Musculoskeletal: Negative.   Skin: Negative.   Neurological: Negative.   Psychiatric/Behavioral: Negative.      Objective:  Physical Exam Constitutional:      Appearance: She is well-developed.  HENT:     Head: Normocephalic and atraumatic.  Cardiovascular:     Rate and Rhythm: Normal rate and regular rhythm.  Pulmonary:     Effort: Pulmonary effort is normal. No respiratory distress.     Breath sounds: Normal breath sounds. No wheezing or rales.  Abdominal:     General: Bowel sounds are normal. There is no distension.     Palpations: Abdomen is soft.     Tenderness: There is no abdominal tenderness. There is no rebound.  Musculoskeletal:     Cervical back: Normal range of motion.  Skin:    General: Skin is warm and dry.  Neurological:     Mental Status: She is alert and oriented to person, place, and time.     Coordination: Coordination normal.     Vitals:   01/28/23 1316 01/28/23 1319  BP: (!) 140/90 (!) 140/90  Pulse: 81   Temp: 98.6 F (37 C)   TempSrc: Oral   SpO2: 96%   Weight: 143 lb (64.9 kg)   Height: 5' 2.5" (1.588 m)    Visit time 15 minutes in face to face communication with patient and coordination of care, additional 5 minutes spent in record review, coordination or care, ordering tests, communicating/referring to other healthcare professionals,  documenting in medical records all on the same day of the visit for total time 20 minutes spent on the visit.   Assessment & Plan:  Flu shot given at visit

## 2023-01-28 NOTE — Addendum Note (Signed)
Addended by: Levonne Lapping on: 01/28/2023 02:03 PM   Modules accepted: Orders

## 2023-01-28 NOTE — Telephone Encounter (Signed)
Patient would like to know if she needs a Hepatitis C screening. She said she had one with her previous provider and didn't know if she needed to again. Patient would like a call back at [phone].

## 2023-01-29 NOTE — Telephone Encounter (Signed)
Unless high risk once in a lifetime is recommended for screening. Please get records of prior

## 2023-01-31 ENCOUNTER — Ambulatory Visit: Payer: PPO | Admitting: Cardiology

## 2023-02-03 DIAGNOSIS — H40053 Ocular hypertension, bilateral: Secondary | ICD-10-CM | POA: Diagnosis not present

## 2023-02-03 DIAGNOSIS — H40023 Open angle with borderline findings, high risk, bilateral: Secondary | ICD-10-CM | POA: Diagnosis not present

## 2023-02-05 NOTE — Telephone Encounter (Signed)
LVM for patient

## 2023-02-05 NOTE — Telephone Encounter (Signed)
Pt states that she has had a hep c screening before and needed to know of she needs another one? I have requested records

## 2023-02-06 NOTE — Telephone Encounter (Signed)
Called pt and informed her of this

## 2023-02-06 NOTE — Telephone Encounter (Signed)
See message below likely does not need another

## 2023-03-07 ENCOUNTER — Other Ambulatory Visit: Payer: Self-pay

## 2023-03-07 DIAGNOSIS — K219 Gastro-esophageal reflux disease without esophagitis: Secondary | ICD-10-CM | POA: Insufficient documentation

## 2023-03-07 DIAGNOSIS — D869 Sarcoidosis, unspecified: Secondary | ICD-10-CM | POA: Insufficient documentation

## 2023-03-07 DIAGNOSIS — I10 Essential (primary) hypertension: Secondary | ICD-10-CM | POA: Insufficient documentation

## 2023-03-10 ENCOUNTER — Ambulatory Visit: Payer: PPO | Admitting: Cardiology

## 2023-03-19 ENCOUNTER — Ambulatory Visit (INDEPENDENT_AMBULATORY_CARE_PROVIDER_SITE_OTHER): Payer: PPO | Admitting: Internal Medicine

## 2023-03-19 ENCOUNTER — Encounter: Payer: Self-pay | Admitting: Internal Medicine

## 2023-03-19 VITALS — BP 140/84 | HR 75 | Temp 97.9°F | Ht 62.25 in | Wt 147.0 lb

## 2023-03-19 DIAGNOSIS — R052 Subacute cough: Secondary | ICD-10-CM | POA: Diagnosis not present

## 2023-03-19 MED ORDER — BENZONATATE 200 MG PO CAPS: 200.0000 mg | ORAL_CAPSULE | Freq: Three times a day (TID) | ORAL | 0 refills | Status: DC | PRN

## 2023-03-19 NOTE — Progress Notes (Signed)
   Subjective:   Patient ID: Natasha Stewart, female    DOB: 1950-05-24, 72 y.o.   MRN: [id number]  HPI The patient is a 72 YO female coming in for cough going on for some weeks. Has tried otc things without relief. Was sick at onset and other symptoms have resolved.   Review of Systems  Constitutional: Negative.   HENT: Negative.    Eyes: Negative.   Respiratory:  Positive for cough. Negative for chest tightness and shortness of breath.   Cardiovascular:  Negative for chest pain, palpitations and leg swelling.  Gastrointestinal:  Negative for abdominal distention, abdominal pain, constipation, diarrhea, nausea and vomiting.  Musculoskeletal: Negative.   Skin: Negative.   Neurological: Negative.   Psychiatric/Behavioral: Negative.      Objective:  Physical Exam Constitutional:      Appearance: She is well-developed.  HENT:     Head: Normocephalic and atraumatic.  Cardiovascular:     Rate and Rhythm: Normal rate and regular rhythm.  Pulmonary:     Effort: Pulmonary effort is normal. No respiratory distress.     Breath sounds: Normal breath sounds. No wheezing or rales.  Abdominal:     General: Bowel sounds are normal. There is no distension.     Palpations: Abdomen is soft.     Tenderness: There is no abdominal tenderness. There is no rebound.  Musculoskeletal:     Cervical back: Normal range of motion.  Skin:    General: Skin is warm and dry.  Neurological:     Mental Status: She is alert and oriented to person, place, and time.     Coordination: Coordination normal.     Vitals:   03/19/23 0938 03/19/23 0940  BP: (!) 140/84 (!) 140/84  Pulse: 75   Temp: 97.9 F (36.6 C)   TempSrc: Oral   SpO2: 96%   Weight: 147 lb (66.7 kg)   Height: 5' 2.25" (1.581 m)     Assessment & Plan:

## 2023-03-19 NOTE — Patient Instructions (Signed)
We have sent in tessalon perles to use up to 3 times a day for the cough and this should help reduce the cough.

## 2023-03-21 ENCOUNTER — Encounter: Payer: Self-pay | Admitting: Internal Medicine

## 2023-03-21 DIAGNOSIS — R052 Subacute cough: Secondary | ICD-10-CM | POA: Insufficient documentation

## 2023-03-21 NOTE — Assessment & Plan Note (Signed)
Use flonase to help and add tessalon perles 200 mg TID prn. Suspect this is post-viral in etiology and will gradually resolve. If no improvement can do CXR.

## 2023-04-12 ENCOUNTER — Other Ambulatory Visit: Payer: Self-pay | Admitting: Internal Medicine

## 2023-04-16 ENCOUNTER — Ambulatory Visit: Payer: PPO | Attending: Cardiology | Admitting: Cardiology

## 2023-04-16 ENCOUNTER — Telehealth (HOSPITAL_BASED_OUTPATIENT_CLINIC_OR_DEPARTMENT_OTHER): Payer: Self-pay

## 2023-04-16 ENCOUNTER — Encounter: Payer: Self-pay | Admitting: Cardiology

## 2023-04-16 VITALS — BP 104/66 | HR 79 | Ht 62.6 in | Wt 151.0 lb

## 2023-04-16 DIAGNOSIS — R7303 Prediabetes: Secondary | ICD-10-CM | POA: Diagnosis not present

## 2023-04-16 DIAGNOSIS — I10 Essential (primary) hypertension: Secondary | ICD-10-CM | POA: Diagnosis not present

## 2023-04-16 DIAGNOSIS — R011 Cardiac murmur, unspecified: Secondary | ICD-10-CM | POA: Diagnosis not present

## 2023-04-16 NOTE — Patient Instructions (Signed)
Medication Instructions:  Your physician recommends that you continue on your current medications as directed. Please refer to the Current Medication list given to you today.  *If you need a refill on your cardiac medications before your next appointment, please call your pharmacy*   Lab Work: None ordered If you have labs (blood work) drawn today and your tests are completely normal, you will receive your results only by: MyChart Message (if you have MyChart) OR A paper copy in the mail If you have any lab test that is abnormal or we need to change your treatment, we will call you to review the results.   Testing/Procedures: Your physician has requested that you have an echocardiogram. Echocardiography is a painless test that uses sound waves to create images of your heart. It provides your doctor with information about the size and shape of your heart and how well your heart's chambers and valves are working. This procedure takes approximately one hour. There are no restrictions for this procedure. Please do NOT wear cologne, perfume, aftershave, or lotions (deodorant is allowed). Please arrive 15 minutes prior to your appointment time.  We will order CT coronary calcium score. It will cost $99.00 and iis due at time of scan.  Please call to schedule.    [facility] [address] [place], [place] [address] [phone]  Follow-Up: At [facility], you and your health needs are our priority.  As part of our continuing mission to provide you with exceptional heart care, we have created designated Provider Care Teams.  These Care Teams include your primary Cardiologist (physician) and Advanced Practice Providers (APPs -  Physician Assistants and Nurse Practitioners) who all work together to provide you with the care you need, when you need it.  We recommend signing up for the patient portal called "MyChart".  Sign up information is provided on this After Visit Summary.   MyChart is used to connect with patients for Virtual Visits (Telemedicine).  Patients are able to view lab/test results, encounter notes, upcoming appointments, etc.  Non-urgent messages can be sent to your provider as well.   To learn more about what you can do with MyChart, go to [email or web address].    Your next appointment:   12 month(s)  The format for your next appointment:   In Person  Provider:   Belva Crome, MD   Other Instructions Echocardiogram An echocardiogram is a test that uses sound waves (ultrasound) to produce images of the heart. Images from an echocardiogram can provide important information about: Heart size and shape. The size and thickness and movement of your heart's walls. Heart muscle function and strength. Heart valve function or if you have stenosis. Stenosis is when the heart valves are too narrow. If blood is flowing backward through the heart valves (regurgitation). A tumor or infectious growth around the heart valves. Areas of heart muscle that are not working well because of poor blood flow or injury from a heart attack. Aneurysm detection. An aneurysm is a weak or damaged part of an artery wall. The wall bulges out from the normal force of blood pumping through the body. Tell a health care provider about: Any allergies you have. All medicines you are taking, including vitamins, herbs, eye drops, creams, and over-the-counter medicines. Any blood disorders you have. Any surgeries you have had. Any medical conditions you have. Whether you are pregnant or may be pregnant. What are the risks? Generally, this is a safe test. However, problems may occur,  including an allergic reaction to dye (contrast) that may be used during the test. What happens before the test? No specific preparation is needed. You may eat and drink normally. What happens during the test? You will take off your clothes from the waist up and put on a hospital  gown. Electrodes or electrocardiogram (ECG)patches may be placed on your chest. The electrodes or patches are then connected to a device that monitors your heart rate and rhythm. You will lie down on a table for an ultrasound exam. A gel will be applied to your chest to help sound waves pass through your skin. A handheld device, called a transducer, will be pressed against your chest and moved over your heart. The transducer produces sound waves that travel to your heart and bounce back (or "echo" back) to the transducer. These sound waves will be captured in real-time and changed into images of your heart that can be viewed on a video monitor. The images will be recorded on a computer and reviewed by your health care provider. You may be asked to change positions or hold your breath for a short time. This makes it easier to get different views or better views of your heart. In some cases, you may receive contrast through an IV in one of your veins. This can improve the quality of the pictures from your heart. The procedure may vary among health care providers and hospitals.   What can I expect after the test? You may return to your normal, everyday life, including diet, activities, and medicines, unless your health care provider tells you not to do that. Follow these instructions at home: It is up to you to get the results of your test. Ask your health care provider, or the department that is doing the test, when your results will be ready. Keep all follow-up visits. This is important. Summary An echocardiogram is a test that uses sound waves (ultrasound) to produce images of the heart. Images from an echocardiogram can provide important information about the size and shape of your heart, heart muscle function, heart valve function, and other possible heart problems. You do not need to do anything to prepare before this test. You may eat and drink normally. After the echocardiogram is completed, you  may return to your normal, everyday life, unless your health care provider tells you not to do that. This information is not intended to replace advice given to you by your health care provider. Make sure you discuss any questions you have with your health care provider. Document Revised: 12/28/2019 Document Reviewed: 12/28/2019 Elsevier Patient Education  2021 Elsevier Inc.  Coronary Calcium Scan A coronary calcium scan is an imaging test used to look for deposits of plaque in the inner lining of the blood vessels of the heart (coronary arteries). Plaque is made up of calcium, protein, and fatty substances. These deposits of plaque can partly clog and narrow the coronary arteries without producing any symptoms or warning signs. This puts a person at risk for a heart attack. A coronary calcium scan is performed using a computed tomography (CT) scanner machine without using a dye (contrast). This test is recommended for people who are at moderate risk for heart disease. The test can find plaque deposits before symptoms develop. Tell a health care provider about: Any allergies you have. All medicines you are taking, including vitamins, herbs, eye drops, creams, and over-the-counter medicines. Any problems you or family members have had with anesthetic medicines. Any bleeding problems  you have. Any surgeries you have had. Any medical conditions you have. Whether you are pregnant or may be pregnant. What are the risks? Generally, this is a safe procedure. However, problems may occur, including: Harm to a pregnant woman and her unborn baby. This test involves the use of radiation. Radiation exposure can be dangerous to a pregnant woman and her unborn baby. If you are pregnant or think you may be pregnant, you should not have this procedure done. A slight increase in the risk of cancer. This is because of the radiation involved in the test. The amount of radiation from one test is similar to the  amount of radiation you are naturally exposed to over one year. What happens before the procedure? Ask your health care provider for any specific instructions on how to prepare for this procedure. You may be asked to avoid products that contain caffeine, tobacco, or nicotine for 4 hours before the procedure. What happens during the procedure?  You will undress and remove any jewelry from your neck or chest. You may need to remove hearing aides and dentures. Women may need to remove their bras. You will put on a hospital gown. Sticky electrodes will be placed on your chest. The electrodes will be connected to an electrocardiogram (ECG) machine to record a tracing of the electrical activity of your heart. You will lie down on your back on a curved bed that is attached to the CT scanner. You may be given medicine to slow down your heart rate so that clear pictures can be created. You will be moved into the CT scanner, and the CT scanner will take pictures of your heart. During this time, you will be asked to lie still and hold your breath for 10-20 seconds at a time while each picture of your heart is being taken. The procedure may vary among health care providers and hospitals. What can I expect after the procedure? You can return to your normal activities. It is up to you to get the results of your procedure. Ask your health care provider, or the department that is doing the procedure, when your results will be ready. Summary A coronary calcium scan is an imaging test used to look for deposits of plaque in the inner lining of the blood vessels of the heart. Plaque is made up of calcium, protein, and fatty substances. A coronary calcium scan is performed using a CT scanner machine without contrast. Generally, this is a safe procedure. Tell your health care provider if you are pregnant or may be pregnant. Ask your health care provider for any specific instructions on how to prepare for this  procedure. You can return to your normal activities after the scan is done. This information is not intended to replace advice given to you by your health care provider. Make sure you discuss any questions you have with your health care provider. Document Revised: 04/15/2021 Document Reviewed: 04/15/2021 Elsevier Patient Education  2024 Elsevier Inc.  Important Information About Sugar

## 2023-04-16 NOTE — Progress Notes (Signed)
Cardiology Office Note:    Date:  04/16/2023   ID:  Natasha Stewart, DOB 1950/06/13, MRN [id number]  PCP:  Myrlene Broker, MD  Cardiologist:  Garwin Brothers, MD   Referring MD: Myrlene Broker, *    ASSESSMENT:    1. Essential hypertension   2. Pre-diabetes   3. Cardiac murmur    PLAN:    In order of problems listed above:  Primary prevention stressed with the patient.  Importance of compliance with diet medication stressed and patient verbalized standing. She was advised to start walking in a graded fashion and increase her exercise to 30 minutes a day 5 days a week she promises to do so.  Weight reduction was also stressed.  Diet emphasized. Cardiac murmur: Echocardiogram will be done to assess murmur heard on auscultation. She was advised coronary calcium evaluation for risk stratification and she is agreeable. Mild elevation in lipids and hemoglobin A1c: I counseled her about this.  Lifestyle modification urged and she promises to do better. Patient will be seen in follow-up appointment in 12 months or earlier if the patient has any concerns. Patient had multiple questions which were answered to her satisfaction.   Medication Adjustments/Labs and Tests Ordered: Current medicines are reviewed at length with the patient today.  Concerns regarding medicines are outlined above.  Orders Placed This Encounter  Procedures   CT CARDIAC SCORING   EKG 12-Lead   ECHOCARDIOGRAM COMPLETE   No orders of the defined types were placed in this encounter.    History of Present Illness:    Natasha Stewart is a 72 y.o. female who is being seen today for the evaluation of risk stratification for coronary artery disease at the request of Myrlene Broker, *.  Patient is a pleasant 72 year old female.  She has past medical history of essential hypertension, diet-controlled diabetes/prediabetes and mild dyslipidemia.  She mentions to me that she has lived a sedentary  lifestyle.  She has not exercise but worked in the yard.  She gives family history of coronary artery disease with her mother having a massive heart attack and therefore she is here for evaluation.  Again with activities of daily living she has no chest pain.  At the time of my evaluation, the patient is alert awake oriented and in no distress.  Past Medical History:  Diagnosis Date   Essential hypertension 02/01/2021   GERD (gastroesophageal reflux disease)    Hypertension    Pre-diabetes 01/31/2021   Routine general medical examination at a health care facility 02/07/2022   Sarcoidosis    per pt report    Past Surgical History:  Procedure Laterality Date   BREAST CYST ASPIRATION Left    COLONOSCOPY  2011    Current Medications: Current Meds  Medication Sig   amLODipine (NORVASC) 5 MG tablet Take 1 tablet by mouth once daily   ascorbic acid (VITAMIN C) 1000 MG tablet Take 1,000 mg by mouth daily.   b complex vitamins tablet Take 1 tablet by mouth daily.   cholecalciferol (VITAMIN D) 1000 UNITS tablet Take 1,000 Units by mouth daily. Take 1 tab daily   diclofenac (VOLTAREN) 75 MG EC tablet Take 1 tablet (75 mg total) by mouth as needed.   fluticasone (FLONASE) 50 MCG/ACT nasal spray Place 2 sprays into both nostrils daily.   losartan (COZAAR) 100 MG tablet Take 1 tablet (100 mg total) by mouth daily.     Allergies:   Hctz [hydrochlorothiazide], Lisinopril, and Penicillin v  Social History   Socioeconomic History   Marital status: Divorced    Spouse name: Not on file   Number of children: Not on file   Years of education: Not on file   Highest education level: 12th grade  Occupational History   Not on file  Tobacco Use   Smoking status: Never   Smokeless tobacco: Never  Vaping Use   Vaping status: Never Used  Substance and Sexual Activity   Alcohol use: Yes    Alcohol/week: 0.0 standard drinks of alcohol    Comment: occasionally   Drug use: Never   Sexual  activity: Not on file  Other Topics Concern   Not on file  Social History Narrative   Tobacco use cigarettes : Never smoked ,Tobacco history last updated 06/23/2013   No smoking Alcohol 1-2 per week. Caffeine 1-2 servings daily coffee, tea    Diet : low fat.Excercise : walks or weights 3-4 times  Per week.   Social Determinants of Health   Financial Resource Strain: Low Risk  (03/18/2023)   Overall Financial Resource Strain (CARDIA)    Difficulty of Paying Living Expenses: Not hard at all  Food Insecurity: No Food Insecurity (03/18/2023)   Hunger Vital Sign    Worried About Running Out of Food in the Last Year: Never true    Ran Out of Food in the Last Year: Never true  Transportation Needs: No Transportation Needs (03/18/2023)   PRAPARE - Administrator, Civil Service (Medical): No    Lack of Transportation (Non-Medical): No  Physical Activity: Insufficiently Active (03/18/2023)   Exercise Vital Sign    Days of Exercise per Week: 3 days    Minutes of Exercise per Session: 40 min  Stress: No Stress Concern Present (03/18/2023)   [employer] of Occupational Health - Occupational Stress Questionnaire    Feeling of Stress : Not at all  Social Connections: Moderately Integrated (03/18/2023)   Social Connection and Isolation Panel [NHANES]    Frequency of Communication with Friends and Family: More than three times a week    Frequency of Social Gatherings with Friends and Family: More than three times a week    Attends Religious Services: More than 4 times per year    Active Member of [employer] or Organizations: Yes    Attends Engineer, structural: More than 4 times per year    Marital Status: Divorced     Family History: The patient's family history is negative for Breast cancer, Colon cancer, Colon polyps, Esophageal cancer, Rectal cancer, and Stomach cancer.  ROS:   Please see the history of present illness.    All other systems reviewed and are  negative.  EKGs/Labs/Other Studies Reviewed:    The following studies were reviewed today:  EKG Interpretation Date/Time:  Wednesday April 16 2023 14:18:04 EST Ventricular Rate:  79 PR Interval:  142 QRS Duration:  78 QT Interval:  382 QTC Calculation: 438 R Axis:   38  Text Interpretation: Normal sinus rhythm Low voltage QRS Cannot rule out Anterior infarct , age undetermined No previous ECGs available Confirmed by Belva Crome [phone]) on 04/16/2023 2:53:33 PM     Recent Labs: 01/28/2023: ALT 15; BUN 16; Creatinine, Ser 0.94; Hemoglobin 13.3; Platelets 293.0; Potassium 4.1; Sodium 139  Recent Lipid Panel    Component Value Date/Time   CHOL 231 (H) 01/28/2023 1341   TRIG 87.0 01/28/2023 1341   HDL 109.40 01/28/2023 1341   CHOLHDL 2 01/28/2023 1341  VLDL 17.4 01/28/2023 1341   LDLCALC 104 (H) 01/28/2023 1341    Physical Exam:    VS:  BP 104/66   Pulse 79   Ht 5' 2.6" (1.59 m)   Wt 151 lb (68.5 kg)   SpO2 97%   BMI 27.09 kg/m     Wt Readings from Last 3 Encounters:  04/16/23 151 lb (68.5 kg)  03/19/23 147 lb (66.7 kg)  01/28/23 143 lb (64.9 kg)     GEN: Patient is in no acute distress HEENT: Normal NECK: No JVD; No carotid bruits LYMPHATICS: No lymphadenopathy CARDIAC: S1 S2 regular, 2/6 systolic murmur at the apex. RESPIRATORY:  Clear to auscultation without rales, wheezing or rhonchi  ABDOMEN: Soft, non-tender, non-distended MUSCULOSKELETAL:  No edema; No deformity  SKIN: Warm and dry NEUROLOGIC:  Alert and oriented x 3 PSYCHIATRIC:  Normal affect    Signed, Garwin Brothers, MD  04/16/2023 3:03 PM    [facility]

## 2023-05-06 ENCOUNTER — Other Ambulatory Visit: Payer: Self-pay | Admitting: Cardiology

## 2023-05-06 DIAGNOSIS — I10 Essential (primary) hypertension: Secondary | ICD-10-CM

## 2023-05-06 DIAGNOSIS — R7303 Prediabetes: Secondary | ICD-10-CM

## 2023-05-06 DIAGNOSIS — R011 Cardiac murmur, unspecified: Secondary | ICD-10-CM

## 2023-06-09 ENCOUNTER — Ambulatory Visit (HOSPITAL_BASED_OUTPATIENT_CLINIC_OR_DEPARTMENT_OTHER)
Admission: RE | Admit: 2023-06-09 | Discharge: 2023-06-09 | Disposition: A | Discharge status: Home / Self Care | Payer: Self-pay | Source: Ambulatory Visit | Attending: Cardiology | Admitting: Cardiology

## 2023-06-09 ENCOUNTER — Ambulatory Visit (HOSPITAL_BASED_OUTPATIENT_CLINIC_OR_DEPARTMENT_OTHER)
Admission: RE | Admit: 2023-06-09 | Discharge: 2023-06-09 | Disposition: A | Discharge status: Home / Self Care | Payer: PPO | Source: Ambulatory Visit | Attending: Cardiology | Admitting: Cardiology

## 2023-06-09 DIAGNOSIS — R011 Cardiac murmur, unspecified: Secondary | ICD-10-CM | POA: Insufficient documentation

## 2023-06-09 DIAGNOSIS — I10 Essential (primary) hypertension: Secondary | ICD-10-CM | POA: Insufficient documentation

## 2023-06-10 LAB — ECHOCARDIOGRAM COMPLETE
AR max vel: 1.88 cm2
AV Area VTI: 1.82 cm2
AV Area mean vel: 1.87 cm2
AV Mean grad: 3 mm[Hg]
AV Peak grad: 6.7 mm[Hg]
Ao pk vel: 1.29 m/s
Area-P 1/2: 5.02 cm2
Calc EF: 61.7 %
MV M vel: 3.45 m/s
MV Peak grad: 47.6 mm[Hg]
S' Lateral: 2.7 cm
Single Plane A2C EF: 63.1 %
Single Plane A4C EF: 62.7 %

## 2023-07-10 ENCOUNTER — Other Ambulatory Visit: Payer: Self-pay | Admitting: Internal Medicine

## 2023-07-24 ENCOUNTER — Encounter: Payer: Self-pay | Admitting: Gastroenterology

## 2023-07-28 DIAGNOSIS — Z1231 Encounter for screening mammogram for malignant neoplasm of breast: Secondary | ICD-10-CM | POA: Diagnosis not present

## 2023-07-28 LAB — HM MAMMOGRAPHY

## 2023-08-04 ENCOUNTER — Telehealth: Payer: Self-pay | Admitting: Internal Medicine

## 2023-08-04 NOTE — Telephone Encounter (Unsigned)
 Copied from CRM [id number]. Topic: Referral - Question >> Aug 04, 2023 10:56 AM Sim Boast F wrote: Reason for CRM: Patient has appointments with gastroenterology in April and wants to know if she needs a referral?

## 2023-08-04 NOTE — Telephone Encounter (Signed)
 Likely no referral needed she should just go to the visit

## 2023-08-22 ENCOUNTER — Ambulatory Visit (AMBULATORY_SURGERY_CENTER)

## 2023-08-22 VITALS — Ht 62.0 in | Wt 152.0 lb

## 2023-08-22 DIAGNOSIS — Z860100 Personal history of colon polyps, unspecified: Secondary | ICD-10-CM

## 2023-08-22 MED ORDER — SUTAB 1479-225-188 MG PO TABS: ORAL_TABLET | ORAL | 0 refills | Status: DC

## 2023-08-22 NOTE — Progress Notes (Signed)

## 2023-09-09 ENCOUNTER — Encounter: Payer: Self-pay | Admitting: Gastroenterology

## 2023-09-11 ENCOUNTER — Ambulatory Visit: Admitting: Gastroenterology

## 2023-09-11 ENCOUNTER — Encounter: Payer: Self-pay | Admitting: Gastroenterology

## 2023-09-11 VITALS — BP 132/75 | HR 66 | Temp 97.3°F | Resp 13 | Ht 62.0 in | Wt 152.0 lb

## 2023-09-11 DIAGNOSIS — K5730 Diverticulosis of large intestine without perforation or abscess without bleeding: Secondary | ICD-10-CM

## 2023-09-11 DIAGNOSIS — Z860101 Personal history of adenomatous and serrated colon polyps: Secondary | ICD-10-CM

## 2023-09-11 DIAGNOSIS — D125 Benign neoplasm of sigmoid colon: Secondary | ICD-10-CM

## 2023-09-11 DIAGNOSIS — D124 Benign neoplasm of descending colon: Secondary | ICD-10-CM | POA: Diagnosis not present

## 2023-09-11 DIAGNOSIS — I10 Essential (primary) hypertension: Secondary | ICD-10-CM | POA: Diagnosis not present

## 2023-09-11 DIAGNOSIS — K648 Other hemorrhoids: Secondary | ICD-10-CM | POA: Diagnosis not present

## 2023-09-11 DIAGNOSIS — Z860100 Personal history of colon polyps, unspecified: Secondary | ICD-10-CM

## 2023-09-11 DIAGNOSIS — K635 Polyp of colon: Secondary | ICD-10-CM

## 2023-09-11 DIAGNOSIS — Z1211 Encounter for screening for malignant neoplasm of colon: Secondary | ICD-10-CM | POA: Diagnosis not present

## 2023-09-11 DIAGNOSIS — R7303 Prediabetes: Secondary | ICD-10-CM | POA: Diagnosis not present

## 2023-09-11 DIAGNOSIS — K641 Second degree hemorrhoids: Secondary | ICD-10-CM

## 2023-09-11 MED ORDER — SODIUM CHLORIDE 0.9 % IV SOLN: 500.0000 mL | Freq: Once | INTRAVENOUS | Status: DC

## 2023-09-11 NOTE — Op Note (Signed)
 [facility] Patient Name: Natasha Stewart Procedure Date: 09/11/2023 10:50 AM MRN: [id number] Endoscopist: Harry Lindau , MD, [id number] Age: 73 Referring MD:  Date of Birth: 27-May-1950 Gender: Female Account #: [id number] Procedure:                Colonoscopy Indications:              Surveillance: Personal history of adenomatous                            polyps on last colonoscopy 3 years ago                           Last colonoscopy was 06/2020 and notable for 3                            subcentimeter adenomas, sigmoid/transverse colon                            diverticulosis, internal hemorrhoids Medicines:                Monitored Anesthesia Care Procedure:                Pre-Anesthesia Assessment:                           - Prior to the procedure, a History and Physical                            was performed, and patient medications and                            allergies were reviewed. The patient's tolerance of                            previous anesthesia was also reviewed. The risks                            and benefits of the procedure and the sedation                            options and risks were discussed with the patient.                            All questions were answered, and informed consent                            was obtained. Prior Anticoagulants: The patient has                            taken no anticoagulant or antiplatelet agents. ASA                            Grade Assessment: II - A patient with mild systemic  disease. After reviewing the risks and benefits,                            the patient was deemed in satisfactory condition to                            undergo the procedure.                           After obtaining informed consent, the colonoscope                            was passed under direct vision. Throughout the                            procedure, the patient's blood pressure,  pulse, and                            oxygen saturations were monitored continuously. The                            CF [id number] #[id number] was introduced through the anus                            and advanced to the the cecum, identified by                            appendiceal orifice and ileocecal valve. The                            colonoscopy was performed without difficulty. The                            patient tolerated the procedure well. The quality                            of the bowel preparation was good. The ileocecal                            valve, appendiceal orifice, and rectum were                            photographed. Scope In: 11:04:12 AM Scope Out: 11:20:16 AM Scope Withdrawal Time: 0 hours 13 minutes 54 seconds  Total Procedure Duration: 0 hours 16 minutes 4 seconds  Findings:                 The perianal and digital rectal examinations were                            normal.                           Four sessile polyps were found in the sigmoid colon  and descending colon. The polyps were 2 to 4 mm in                            size. These polyps were removed with a cold snare.                            Resection and retrieval were complete. Estimated                            blood loss was minimal.                           Multiple small-mouthed diverticula were found in                            the sigmoid colon, transverse colon and ascending                            colon.                           Non-bleeding internal hemorrhoids were found during                            retroflexion. The hemorrhoids were small. Complications:            No immediate complications. Estimated Blood Loss:     Estimated blood loss was minimal. Impression:               - Four 2 to 4 mm polyps in the sigmoid colon and in                            the descending colon, removed with a cold snare.                            Resected  and retrieved.                           - Diverticulosis in the sigmoid colon, in the                            transverse colon and in the ascending colon.                           - Non-bleeding internal hemorrhoids. Recommendation:           - Patient has a contact number available for                            emergencies. The signs and symptoms of potential                            delayed complications were discussed with the  patient. Return to normal activities tomorrow.                            Written discharge instructions were provided to the                            patient.                           - Resume previous diet.                           - Continue present medications.                           - Await pathology results.                           - Repeat colonoscopy for surveillance based on                            pathology results.                           - Return to GI clinic PRN. Harry Lindau, MD 09/11/2023 11:24:50 AM

## 2023-09-11 NOTE — Patient Instructions (Signed)
 Resume regular diet Resume regular medications Await pathology results Repeat colonoscopy for surveillance based on pathology results. Return to GI clinic as needed. See handouts for polyps, diverticulosis and hemorrhoids  YOU HAD AN ENDOSCOPIC PROCEDURE TODAY AT THE [facility]:   Refer to the procedure report that was given to you for any specific questions about what was found during the examination.  If the procedure report does not answer your questions, please call your gastroenterologist to clarify.  If you requested that your care partner not be given the details of your procedure findings, then the procedure report has been included in a sealed envelope for you to review at your convenience later.  YOU SHOULD EXPECT: Some feelings of bloating in the abdomen. Passage of more gas than usual.  Walking can help get rid of the air that was put into your GI tract during the procedure and reduce the bloating. If you had a lower endoscopy (such as a colonoscopy or flexible sigmoidoscopy) you may notice spotting of blood in your stool or on the toilet paper. If you underwent a bowel prep for your procedure, you may not have a normal bowel movement for a few days.  Please Note:  You might notice some irritation and congestion in your nose or some drainage.  This is from the oxygen used during your procedure.  There is no need for concern and it should clear up in a day or so.  SYMPTOMS TO REPORT IMMEDIATELY:  Following lower endoscopy (colonoscopy or flexible sigmoidoscopy):  Excessive amounts of blood in the stool  Significant tenderness or worsening of abdominal pains  Swelling of the abdomen that is new, acute  Fever of 100F or higher  For urgent or emergent issues, a gastroenterologist can be reached at any hour by calling [phone]. Do not use MyChart messaging for urgent concerns.  DIET:  We do recommend a small meal at first, but then you may proceed to your regular  diet.  Drink plenty of fluids but you should avoid alcoholic beverages for 24 hours.  ACTIVITY:  You should plan to take it easy for the rest of today and you should NOT DRIVE or use heavy machinery until tomorrow (because of the sedation medicines used during the test).    FOLLOW UP: Our staff will call the number listed on your records the next business day following your procedure.  We will call around 7:15- 8:00 am to check on you and address any questions or concerns that you may have regarding the information given to you following your procedure. If we do not reach you, we will leave a message.     If any biopsies were taken you will be contacted by phone or by letter within the next 1-3 weeks.  Please call us  at [phone] if you have not heard about the biopsies in 3 weeks.   SIGNATURES/CONFIDENTIALITY: You and/or your care partner have signed paperwork which will be entered into your electronic medical record.  These signatures attest to the fact that that the information above on your After Visit Summary has been reviewed and is understood.  Full responsibility of the confidentiality of this discharge information lies with you and/or your care-partner.

## 2023-09-11 NOTE — Progress Notes (Signed)
 Pt's states no medical or surgical changes since previsit or office visit.

## 2023-09-11 NOTE — Progress Notes (Signed)
 Called to room to assist during endoscopic procedure.  Patient ID and intended procedure confirmed with present staff. Received instructions for my participation in the procedure from the performing physician.

## 2023-09-11 NOTE — Progress Notes (Signed)
 GASTROENTEROLOGY PROCEDURE H&P NOTE   Primary Care Physician: Adelia Homestead, MD    Reason for Procedure:  Colon polyp surveillance  Plan:    Colonoscopy  Patient is appropriate for endoscopic procedure(s) in the ambulatory ([facility]) setting.  The nature of the procedure, as well as the risks, benefits, and alternatives were carefully and thoroughly reviewed with the patient. Ample time for discussion and questions allowed. The patient understood, was satisfied, and agreed to proceed.     HPI: Natasha Stewart is a 73 y.o. female who presents for colonoscopy for ongoing colon polyp surveillance and colon cancer screening.  No active GI symptoms.  No known family history of colon cancer or related malignancy.  Patient is otherwise without complaints or active issues today.  Last colonoscopy was 06/2020 and notable for 3 subcentimeter adenomas, sigmoid/transverse colon diverticulosis, internal hemorrhoids, with recommendation to repeat in 3 years for ongoing polyp surveillance.  Past Medical History:  Diagnosis Date   Essential hypertension 02/01/2021   GERD (gastroesophageal reflux disease)    Hypertension    Pre-diabetes 01/31/2021   Routine general medical examination at a health care facility 02/07/2022   Sarcoidosis    per pt report    Past Surgical History:  Procedure Laterality Date   BREAST CYST ASPIRATION Left    COLONOSCOPY  2011    Prior to Admission medications   Medication Sig Start Date End Date Taking? Authorizing Provider  amLODipine  (NORVASC ) 5 MG tablet Take 5 mg by mouth daily.   Yes [provider]  ascorbic acid (VITAMIN C) 1000 MG tablet Take 1,000 mg by mouth daily.   Yes [provider]  b complex vitamins tablet Take 1 tablet by mouth daily.   Yes [provider]  cholecalciferol (VITAMIN D) 1000 UNITS tablet Take 1,000 Units by mouth daily. Take 1 tab daily   Yes [provider]  losartan  (COZAAR ) 100 MG  tablet Take 1 tablet by mouth once daily 07/10/23  Yes Adelia Homestead, MD  diclofenac  (VOLTAREN ) 75 MG EC tablet Take 1 tablet (75 mg total) by mouth as needed. Patient not taking: Reported on 08/22/2023 02/04/22   Adelia Homestead, MD  fluticasone  (FLONASE ) 50 MCG/ACT nasal spray Place 2 sprays into both nostrils daily. 02/04/22   Adelia Homestead, MD    Current Outpatient Medications  Medication Sig Dispense Refill   amLODipine  (NORVASC ) 5 MG tablet Take 5 mg by mouth daily.     ascorbic acid (VITAMIN C) 1000 MG tablet Take 1,000 mg by mouth daily.     b complex vitamins tablet Take 1 tablet by mouth daily.     cholecalciferol (VITAMIN D) 1000 UNITS tablet Take 1,000 Units by mouth daily. Take 1 tab daily     losartan  (COZAAR ) 100 MG tablet Take 1 tablet by mouth once daily 90 tablet 0   diclofenac  (VOLTAREN ) 75 MG EC tablet Take 1 tablet (75 mg total) by mouth as needed. (Patient not taking: Reported on 08/22/2023) 90 tablet 3   fluticasone  (FLONASE ) 50 MCG/ACT nasal spray Place 2 sprays into both nostrils daily. 48 g 3   Current Facility-Administered Medications  Medication Dose Route Frequency Provider Last Rate Last Admin   0.9 %  sodium chloride  infusion  500 mL Intravenous Once Zulma Court V, DO        Allergies as of 09/11/2023 - Review Complete 09/11/2023  Allergen Reaction Noted   Lisinopril Cough 03/29/2014   Penicillin v Other (See Comments) 03/29/2014  Hctz [hydrochlorothiazide] Nausea Only 03/29/2014    Family History  Problem Relation Age of Onset   Breast cancer Neg Hx    Colon cancer Neg Hx    Colon polyps Neg Hx    Esophageal cancer Neg Hx    Rectal cancer Neg Hx    Stomach cancer Neg Hx     Social History   Socioeconomic History   Marital status: Divorced    Spouse name: Not on file   Number of children: Not on file   Years of education: Not on file   Highest education level: 12th grade  Occupational History   Not on file  Tobacco  Use   Smoking status: Never   Smokeless tobacco: Never  Vaping Use   Vaping status: Never Used  Substance and Sexual Activity   Alcohol use: Yes    Alcohol/week: 0.0 standard drinks of alcohol    Comment: occasionally   Drug use: Never   Sexual activity: Not on file  Other Topics Concern   Not on file  Social History Narrative   Tobacco use cigarettes : Never smoked ,Tobacco history last updated 06/23/2013   No smoking Alcohol 1-2 per week. Caffeine 1-2 servings daily coffee, tea    Diet : low fat.Excercise : walks or weights 3-4 times  Per week.   Social Drivers of Corporate investment banker Strain: Low Risk  (03/18/2023)   Overall Financial Resource Strain (CARDIA)    Difficulty of Paying Living Expenses: Not hard at all  Food Insecurity: No Food Insecurity (03/18/2023)   Hunger Vital Sign    Worried About Running Out of Food in the Last Year: Never true    Ran Out of Food in the Last Year: Never true  Transportation Needs: No Transportation Needs (03/18/2023)   PRAPARE - Administrator, Civil Service (Medical): No    Lack of Transportation (Non-Medical): No  Physical Activity: Insufficiently Active (03/18/2023)   Exercise Vital Sign    Days of Exercise per Week: 3 days    Minutes of Exercise per Session: 40 min  Stress: No Stress Concern Present (03/18/2023)   [employer] of Occupational Health - Occupational Stress Questionnaire    Feeling of Stress : Not at all  Social Connections: Moderately Integrated (03/18/2023)   Social Connection and Isolation Panel [NHANES]    Frequency of Communication with Friends and Family: More than three times a week    Frequency of Social Gatherings with Friends and Family: More than three times a week    Attends Religious Services: More than 4 times per year    Active Member of [employer] or Organizations: Yes    Attends Engineer, structural: More than 4 times per year    Marital Status: Divorced  Intimate  Partner Violence: Not At Risk (10/15/2022)   Humiliation, Afraid, Rape, and Kick questionnaire    Fear of Current or Ex-Partner: No    Emotionally Abused: No    Physically Abused: No    Sexually Abused: No    Physical Exam: Vital signs in last 24 hours: @BP  (!) 148/88   Pulse 69   Temp (!) 97.3 F (36.3 C) (Temporal)   Ht 5\' 2"  (1.575 m)   Wt 152 lb (68.9 kg)   SpO2 100%   BMI 27.80 kg/m  GEN: NAD EYE: Sclerae anicteric ENT: MMM CV: Non-tachycardic Pulm: CTA b/l GI: Soft, NT/ND NEURO:  Alert & Oriented x 3   Harry Lindau, DO [facility]  09/11/2023 10:48 AM

## 2023-09-11 NOTE — Progress Notes (Signed)
 Report to PACU, RN, vss, BBS= Clear.

## 2023-09-12 ENCOUNTER — Telehealth: Payer: Self-pay

## 2023-09-12 NOTE — Telephone Encounter (Signed)
  Follow up Call-     09/11/2023   10:00 AM  Call back number  Post procedure Call Back phone  # [phone]  Permission to leave phone message Yes     Left message

## 2023-09-15 LAB — SURGICAL PATHOLOGY

## 2023-09-17 ENCOUNTER — Encounter: Payer: Self-pay | Admitting: Gastroenterology

## 2023-10-05 ENCOUNTER — Other Ambulatory Visit: Payer: Self-pay | Admitting: Internal Medicine

## 2023-10-20 ENCOUNTER — Other Ambulatory Visit: Payer: Self-pay | Admitting: Internal Medicine

## 2023-10-20 ENCOUNTER — Encounter: Payer: Self-pay | Admitting: Internal Medicine

## 2023-10-20 ENCOUNTER — Ambulatory Visit (INDEPENDENT_AMBULATORY_CARE_PROVIDER_SITE_OTHER): Admitting: Internal Medicine

## 2023-10-20 VITALS — BP 120/86 | HR 86 | Temp 97.8°F | Ht 62.0 in | Wt 150.1 lb

## 2023-10-20 DIAGNOSIS — K219 Gastro-esophageal reflux disease without esophagitis: Secondary | ICD-10-CM | POA: Diagnosis not present

## 2023-10-20 DIAGNOSIS — R7303 Prediabetes: Secondary | ICD-10-CM

## 2023-10-20 DIAGNOSIS — Z0000 Encounter for general adult medical examination without abnormal findings: Secondary | ICD-10-CM | POA: Diagnosis not present

## 2023-10-20 DIAGNOSIS — I10 Essential (primary) hypertension: Secondary | ICD-10-CM | POA: Diagnosis not present

## 2023-10-20 LAB — LIPID PANEL
Cholesterol: 237 mg/dL — ABNORMAL HIGH (ref 0–200)
HDL: 102.1 mg/dL (ref >39.00)
LDL Cholesterol: 118 mg/dL — ABNORMAL HIGH (ref 0–99)
NonHDL: 134.69
Total CHOL/HDL Ratio: 2
Triglycerides: 82 mg/dL (ref 0.0–149.0)
VLDL: 16.4 mg/dL (ref 0.0–40.0)

## 2023-10-20 LAB — COMPREHENSIVE METABOLIC PANEL WITH GFR
ALT: 12 U/L (ref 0–35)
AST: 13 U/L (ref 0–37)
Albumin: 4.3 g/dL (ref 3.5–5.2)
Alkaline Phosphatase: 103 U/L (ref 39–117)
BUN: 15 mg/dL (ref 6–23)
CO2: 28 meq/L (ref 19–32)
Calcium: 9.3 mg/dL (ref 8.4–10.5)
Chloride: 103 meq/L (ref 96–112)
Creatinine, Ser: 0.92 mg/dL (ref 0.40–1.20)
GFR: 62.41 mL/min (ref >60.00)
Glucose, Bld: 85 mg/dL (ref 70–99)
Potassium: 4.2 meq/L (ref 3.5–5.1)
Sodium: 139 meq/L (ref 135–145)
Total Bilirubin: 0.5 mg/dL (ref 0.2–1.2)
Total Protein: 7.1 g/dL (ref 6.0–8.3)

## 2023-10-20 LAB — HEMOGLOBIN A1C: Hgb A1c MFr Bld: 6 % (ref 4.6–6.5)

## 2023-10-20 LAB — CBC
HCT: 40.4 % (ref 36.0–46.0)
Hemoglobin: 13.4 g/dL (ref 12.0–15.0)
MCHC: 33 g/dL (ref 30.0–36.0)
MCV: 81 fl (ref 78.0–100.0)
Platelets: 258 10*3/uL (ref 150.0–400.0)
RBC: 4.99 Mil/uL (ref 3.87–5.11)
RDW: 13.8 % (ref 11.5–15.5)
WBC: 6.7 10*3/uL (ref 4.0–10.5)

## 2023-10-20 MED ORDER — LOSARTAN POTASSIUM 100 MG PO TABS: 100.0000 mg | ORAL_TABLET | Freq: Every day | ORAL | 3 refills | Status: AC

## 2023-10-20 MED ORDER — AMLODIPINE BESYLATE 5 MG PO TABS: 5.0000 mg | ORAL_TABLET | Freq: Every day | ORAL | 3 refills | Status: AC

## 2023-10-20 MED ORDER — FLUTICASONE PROPIONATE 50 MCG/ACT NA SUSP: 2.0000 | Freq: Every day | NASAL | 3 refills | Status: AC

## 2023-10-20 MED ORDER — DICLOFENAC SODIUM 75 MG PO TBEC: 75.0000 mg | DELAYED_RELEASE_TABLET | ORAL | 3 refills | Status: DC | PRN

## 2023-10-20 NOTE — Assessment & Plan Note (Signed)
 Checking HgA1c and adjust as needed.

## 2023-10-20 NOTE — Assessment & Plan Note (Signed)
 Rare symptoms and takes otc.

## 2023-10-20 NOTE — Assessment & Plan Note (Signed)
BP at goal on amlodipine and losartan. Checking CMP and adjust as needed.  

## 2023-10-20 NOTE — Assessment & Plan Note (Signed)
 Flu shot yearly. Pneumonia complete. Shingrix complete. Tetanus up to date. Colonoscopy up to date. Mammogram up to date, pap smear aged out and dexa due. Counseled about sun safety and mole surveillance. Counseled about the dangers of distracted driving. Given 10 year screening recommendations.

## 2023-10-20 NOTE — Progress Notes (Signed)
   Subjective:   Patient ID: Natasha Stewart, female    DOB: 1951/02/06, 73 y.o.   MRN: [id number]  HPI The patient is here for physical.  PMH, [facility], social history reviewed and updated  Review of Systems  Constitutional: Negative.   HENT: Negative.    Eyes: Negative.   Respiratory:  Negative for cough, chest tightness and shortness of breath.   Cardiovascular:  Negative for chest pain, palpitations and leg swelling.  Gastrointestinal:  Negative for abdominal distention, abdominal pain, constipation, diarrhea, nausea and vomiting.  Musculoskeletal: Negative.   Skin: Negative.   Neurological: Negative.   Psychiatric/Behavioral: Negative.      Objective:  Physical Exam Constitutional:      Appearance: She is well-developed.  HENT:     Head: Normocephalic and atraumatic.  Cardiovascular:     Rate and Rhythm: Normal rate and regular rhythm.  Pulmonary:     Effort: Pulmonary effort is normal. No respiratory distress.     Breath sounds: Normal breath sounds. No wheezing or rales.  Abdominal:     General: Bowel sounds are normal. There is no distension.     Palpations: Abdomen is soft.     Tenderness: There is no abdominal tenderness. There is no rebound.  Musculoskeletal:     Cervical back: Normal range of motion.  Skin:    General: Skin is warm and dry.  Neurological:     Mental Status: She is alert and oriented to person, place, and time.     Coordination: Coordination normal.     Vitals:   10/20/23 1451  BP: 120/86  Pulse: 86  Temp: 97.8 F (36.6 C)  TempSrc: Temporal  SpO2: 96%  Weight: 150 lb 2 oz (68.1 kg)  Height: 5\' 2"  (1.575 m)    Assessment & Plan:

## 2023-10-21 ENCOUNTER — Ambulatory Visit: Payer: Self-pay | Admitting: Internal Medicine

## 2023-10-21 NOTE — Telephone Encounter (Signed)
 Frequency is 1 tablet as needed please advise?

## 2023-12-05 ENCOUNTER — Encounter: Payer: Self-pay | Admitting: Advanced Practice Midwife

## 2023-12-10 DIAGNOSIS — M26609 Unspecified temporomandibular joint disorder, unspecified side: Secondary | ICD-10-CM | POA: Diagnosis not present

## 2024-06-18 ENCOUNTER — Encounter: Payer: Self-pay | Admitting: Internal Medicine

## 2024-06-18 ENCOUNTER — Ambulatory Visit: Admitting: Internal Medicine

## 2024-06-18 VITALS — BP 130/80 | HR 76 | Temp 97.9°F | Ht 62.0 in | Wt 152.4 lb

## 2024-06-18 DIAGNOSIS — R7303 Prediabetes: Secondary | ICD-10-CM

## 2024-06-18 DIAGNOSIS — F5101 Primary insomnia: Secondary | ICD-10-CM | POA: Insufficient documentation

## 2024-06-18 LAB — HEMOGLOBIN A1C: Hgb A1c MFr Bld: 6.3 % (ref 4.6–6.5)

## 2024-06-18 NOTE — Progress Notes (Signed)
 "  Subjective:   Patient ID: Natasha Stewart, female    DOB: 22-Apr-1951, 74 y.o.   MRN: [id number]  Discussed the use of AI scribe software for clinical note transcription with the patient, who gave verbal consent to proceed.  History of Present Illness Natasha Stewart is a 74 year old female who presents with insomnia.  She has been experiencing significant difficulty with sleep over the past few weeks, primarily due to an inability to fall asleep at night despite feeling sleepy. Once asleep, she generally stays asleep, although she occasionally wakes up to use the bathroom, which has been a longstanding pattern. Recently, she finds it difficult to fall back asleep after waking.  She has been using Tylenol PM to aid her sleep, which she finds effective, but she is concerned about long-term use. She denies any recent changes in medications, over-the-counter drugs, or lifestyle that could account for her sleep issues. She does not have a structured bedtime routine and typically watches TV before bed. She does not use phones or other devices at bedtime.  Her brother, who has been living with her for years, recently had a stroke, which has been a source of stress. She feels that he is not taking initiative in his recovery, which adds to her stress. She acknowledges that this situation might be contributing to her sleep difficulties, although she does not feel overtly stressed.  In terms of past medical history, she recalls a neck ultrasound performed three to four years ago due to a palpable lump, which was determined to be benign. The lump has not changed in size and occasionally causes soreness, but she has no swallowing difficulties.  Review of Systems  Constitutional: Negative.   HENT: Negative.    Eyes: Negative.   Respiratory:  Negative for cough, chest tightness and shortness of breath.   Cardiovascular:  Negative for chest pain, palpitations and leg swelling.  Gastrointestinal:  Negative for  abdominal distention, abdominal pain, constipation, diarrhea, nausea and vomiting.  Musculoskeletal: Negative.   Skin: Negative.   Neurological: Negative.   Psychiatric/Behavioral:  Positive for sleep disturbance.     Objective:  Physical Exam Constitutional:      Appearance: She is well-developed.  HENT:     Head: Normocephalic and atraumatic.  Cardiovascular:     Rate and Rhythm: Normal rate and regular rhythm.  Pulmonary:     Effort: Pulmonary effort is normal. No respiratory distress.     Breath sounds: Normal breath sounds. No wheezing or rales.  Abdominal:     General: Bowel sounds are normal. There is no distension.     Palpations: Abdomen is soft.     Tenderness: There is no abdominal tenderness.  Musculoskeletal:     Cervical back: Normal range of motion.  Skin:    General: Skin is warm and dry.  Neurological:     Mental Status: She is alert and oriented to person, place, and time.     Coordination: Coordination normal.     Vitals:   06/18/24 1159  BP: 130/80  Pulse: 76  Temp: 97.9 F (36.6 C)  TempSrc: Oral  SpO2: 99%  Weight: 152 lb 6.4 oz (69.1 kg)  Height: 5' 2 (1.575 m)    Assessment and Plan Assessment & Plan Insomnia   Recent onset of insomnia is likely due to stress from caregiving, with no prior sleep issues. Tylenol PM provides effective short-term relief, while melatonin has been ineffective. Stress and emotional factors are potential triggers. Continue Tylenol PM  for short-term relief, but avoid long-term use due to fall risk. Consider stress reduction techniques such as counseling, meditation, and journaling. Establish a regular bedtime routine for better sleep hygiene.  Pre-diabetes Checking HgA1c and adjust as needed.   Benign neck mass   A soft tissue prominence has been present for 3-4 years, with a previous ultrasound confirming it as benign. There is no significant growth or symptoms of malignancy, though occasional soreness is noted.  Monitor for any changes in size or symptoms, with no re-evaluation needed unless changes occur.   "

## 2024-06-22 ENCOUNTER — Ambulatory Visit: Payer: Self-pay | Admitting: Internal Medicine

## 2024-10-22 ENCOUNTER — Encounter: Admitting: Internal Medicine
